# Patient Record
Sex: Female | Born: 1955 | Race: Black or African American | Hispanic: No | Smoking: Never smoker
Health system: Southern US, Community
[De-identification: ages and names within clinical notes are randomized; demographics above are authoritative.]

## PROBLEM LIST (undated history)

## (undated) DIAGNOSIS — C50919 Malignant neoplasm of unspecified site of unspecified female breast: Secondary | ICD-10-CM

## (undated) HISTORY — DX: Malignant neoplasm of unspecified site of unspecified female breast: C50.919

## (undated) HISTORY — PX: APPENDECTOMY: SHX54

## (undated) HISTORY — PX: OTHER SURGICAL HISTORY: SHX169

---

## 1997-10-10 ENCOUNTER — Other Ambulatory Visit: Admission: RE | Admit: 1997-10-10 | Discharge: 1997-10-10 | Payer: Self-pay | Admitting: *Deleted

## 1998-10-16 ENCOUNTER — Other Ambulatory Visit: Admission: RE | Admit: 1998-10-16 | Discharge: 1998-10-16 | Payer: Self-pay | Admitting: Obstetrics and Gynecology

## 2000-02-04 ENCOUNTER — Other Ambulatory Visit: Admission: RE | Admit: 2000-02-04 | Discharge: 2000-02-04 | Payer: Self-pay | Admitting: *Deleted

## 2000-07-02 ENCOUNTER — Encounter: Admission: RE | Admit: 2000-07-02 | Discharge: 2000-07-02 | Payer: Self-pay | Admitting: *Deleted

## 2000-07-02 ENCOUNTER — Encounter: Payer: Self-pay | Admitting: *Deleted

## 2001-07-12 ENCOUNTER — Other Ambulatory Visit: Admission: RE | Admit: 2001-07-12 | Discharge: 2001-07-12 | Payer: Self-pay | Admitting: *Deleted

## 2002-11-01 ENCOUNTER — Other Ambulatory Visit: Admission: RE | Admit: 2002-11-01 | Discharge: 2002-11-01 | Payer: Self-pay | Admitting: *Deleted

## 2005-10-02 ENCOUNTER — Encounter: Admission: RE | Admit: 2005-10-02 | Discharge: 2005-10-02 | Payer: Self-pay | Admitting: Internal Medicine

## 2005-10-08 ENCOUNTER — Encounter: Admission: RE | Admit: 2005-10-08 | Discharge: 2006-01-06 | Payer: Self-pay | Admitting: Internal Medicine

## 2005-11-06 ENCOUNTER — Encounter: Admission: RE | Admit: 2005-11-06 | Discharge: 2005-11-06 | Payer: Self-pay | Admitting: Internal Medicine

## 2006-10-20 ENCOUNTER — Encounter (INDEPENDENT_AMBULATORY_CARE_PROVIDER_SITE_OTHER): Payer: Self-pay | Admitting: Surgery

## 2006-10-20 ENCOUNTER — Encounter: Admission: RE | Admit: 2006-10-20 | Discharge: 2006-10-20 | Payer: Self-pay | Admitting: Internal Medicine

## 2006-10-20 ENCOUNTER — Inpatient Hospital Stay (HOSPITAL_COMMUNITY): Admission: EM | Admit: 2006-10-20 | Discharge: 2006-10-22 | Payer: Self-pay | Admitting: Emergency Medicine

## 2009-01-10 IMAGING — CT CT ABDOMEN W/ CM
2 of 5 series · 17 of 46 positions shown, 19 images · IV contrast (READICAT/WATER & [ID] OMNI 300)
Comparison: None

ABDOMEN CT WITH CONTRAST

CLINICAL DATA: Abdominal pain in the right lower quadrant.
TECHNIQUE: Multidetector CT imaging of the abdomen and pelvis was performed
following the standard protocol during bolus administration of intravenous
contrast.

Contrast:  125 cc Omnipaque 300

[Series 2: abdomen w/ · axial · 0.70mm/px · z∈[-306,+74]mm · 14 of 81 slices shown, 16 images]
[im 5/81  soft-tissue]
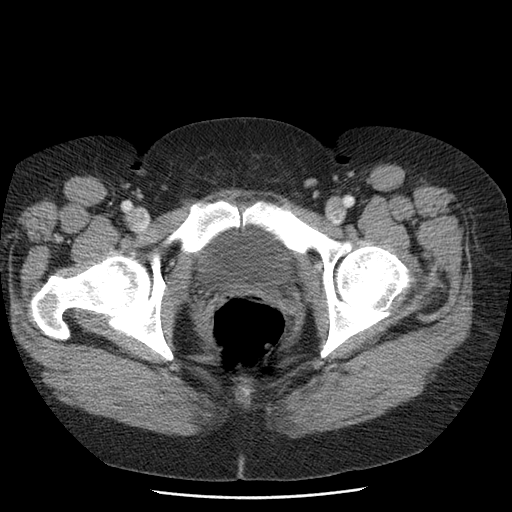
[im 5/81  bone]
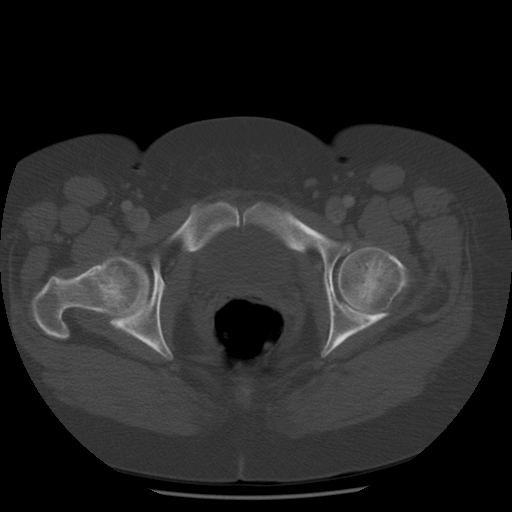
[im 9/81  soft-tissue]
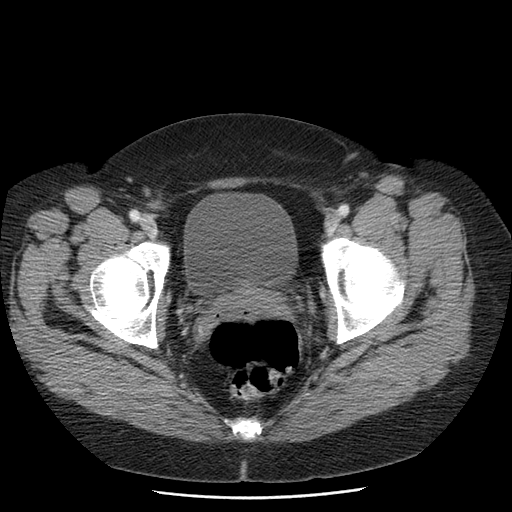
[im 17/81  soft-tissue]
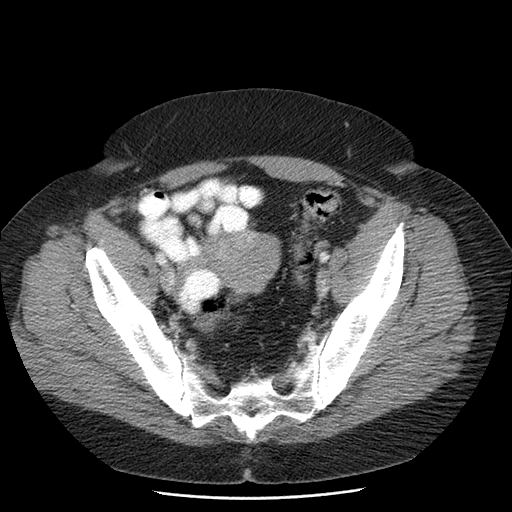
[im 22/81  soft-tissue]
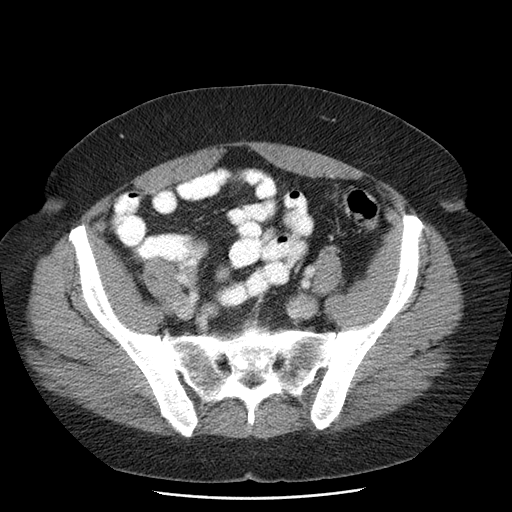
[im 26/81  soft-tissue]
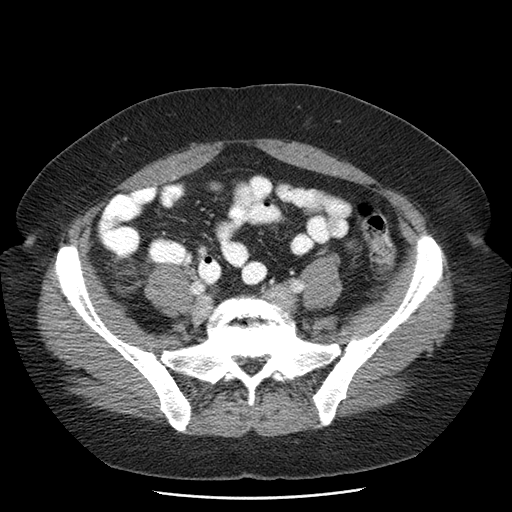
[im 34/81  soft-tissue]
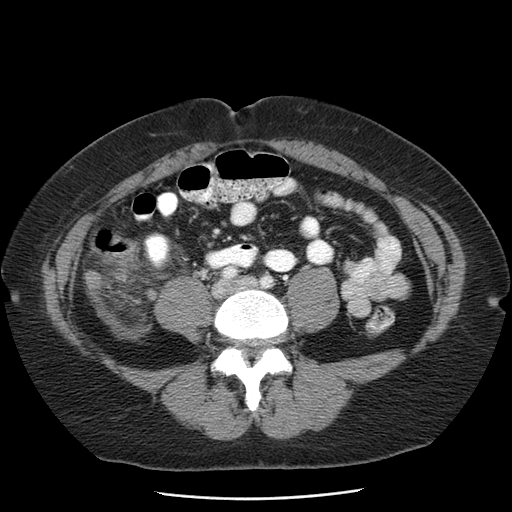
[im 38/81  soft-tissue]
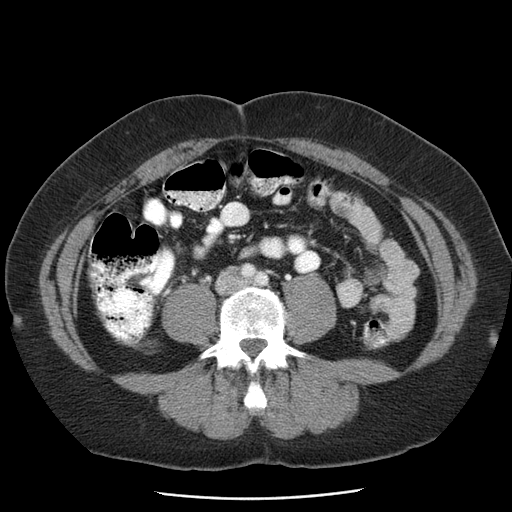
[im 43/81  soft-tissue]
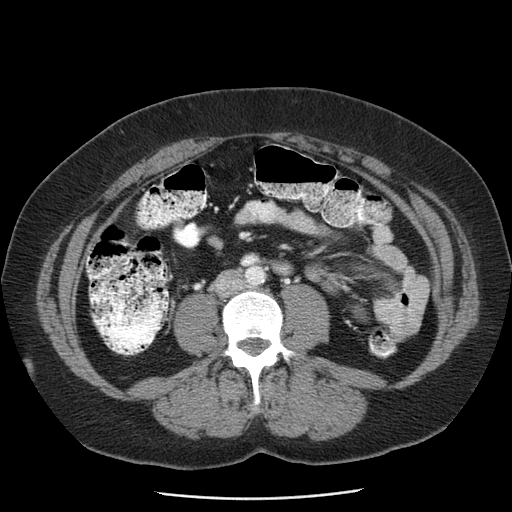
[im 47/81  soft-tissue]
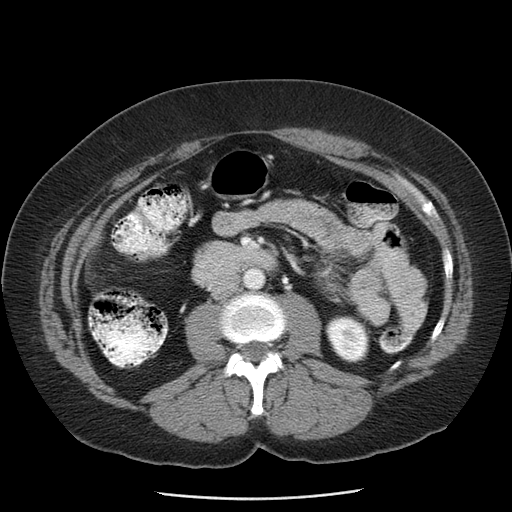
[im 47/81  bone]
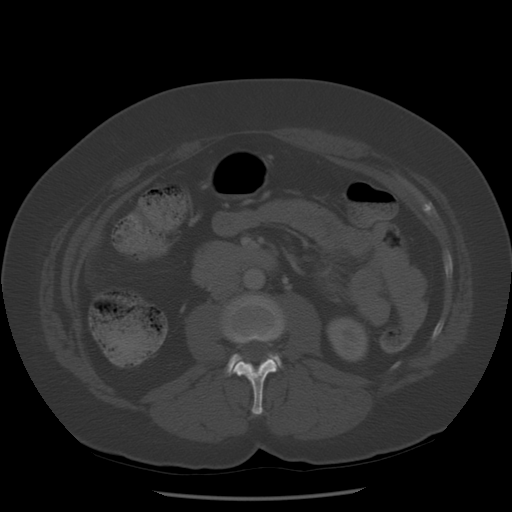
[im 55/81  soft-tissue]
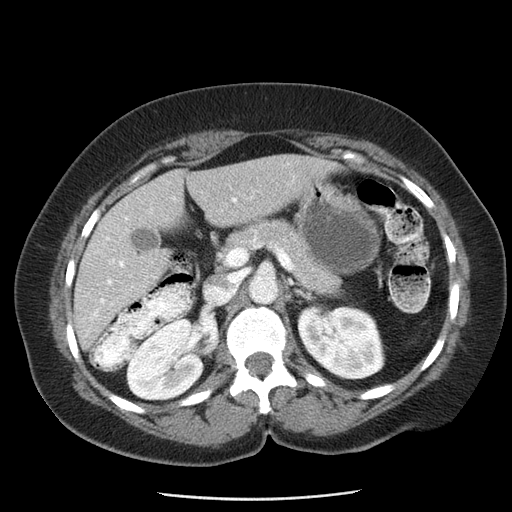
[im 59/81  soft-tissue]
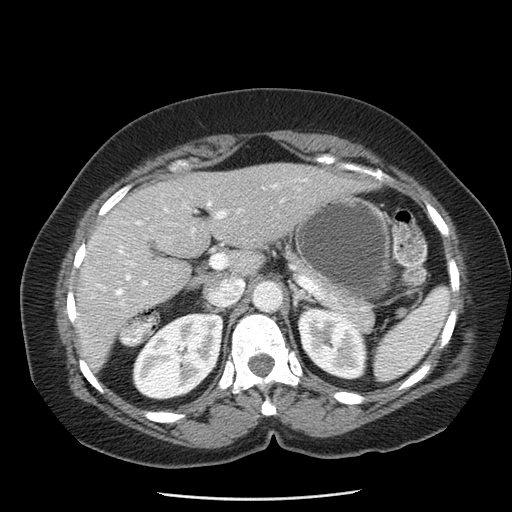
[im 64/81  soft-tissue]
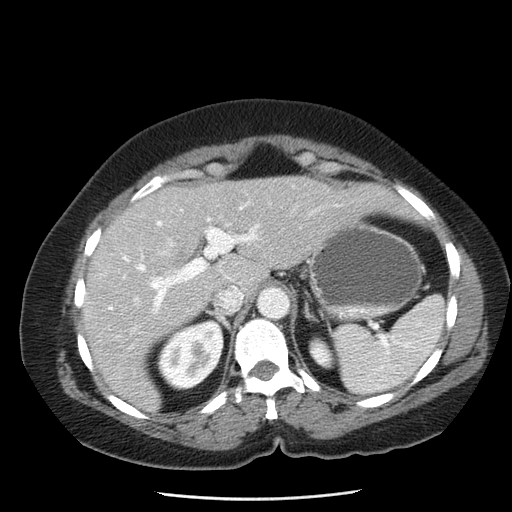
[im 72/81  soft-tissue]
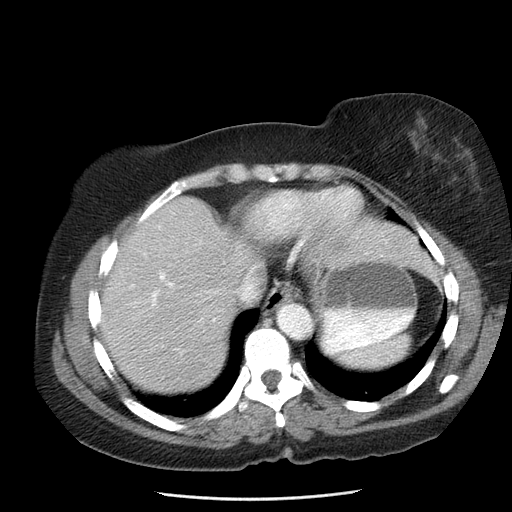
[im 76/81  soft-tissue]
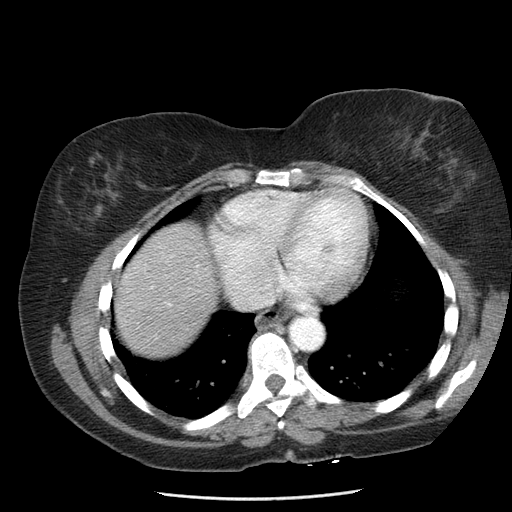

[Series 401: coronal · coronal · 0.91mm/px · 3 of 127 slices shown]
[im 43/127  soft-tissue]
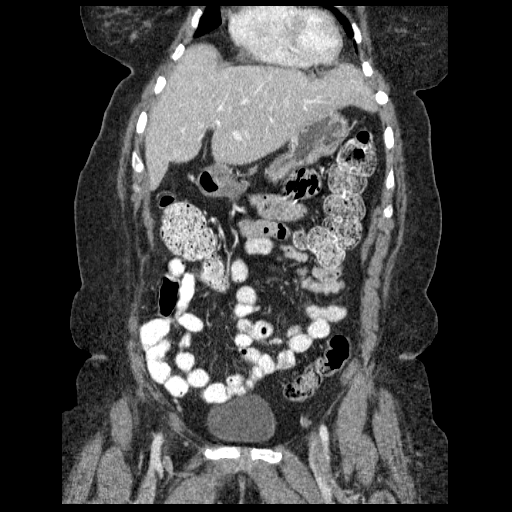
[im 57/127  soft-tissue]
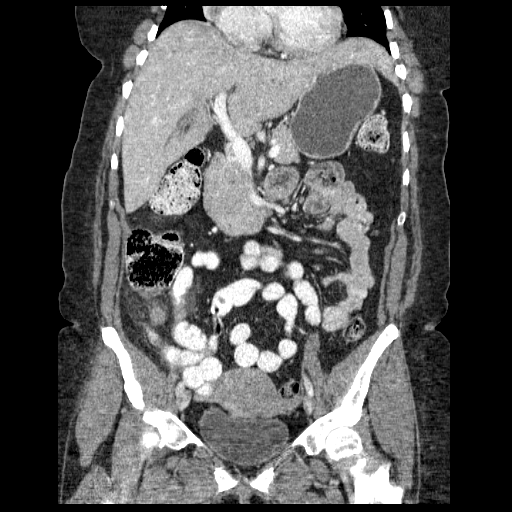
[im 71/127  soft-tissue]
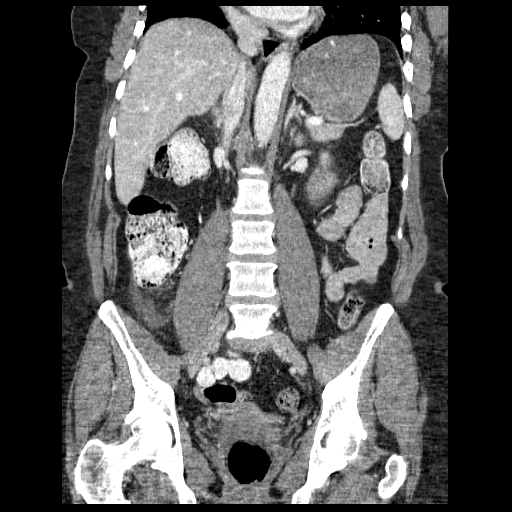

[17 of 46 positions shown; findings below may reference images not displayed]

FINDINGS: The liver, gallbladder, spleen, pancreas, and adrenal glands appear
unremarkable. The kidneys appear normal. No pathologic retroperitoneal
adenopathy in the upper abdomen is identified.

IMPRESSION

No acute upper abdominal findings.

PELVIS CT WITH CONTRAST
FINDINGS: Acute appendicitis is present, with the appendix just below the cecum
at the level of the iliac crests, and with surrounding edema and considerable
appendiceal wall thickening. No definite extraluminal gas is identified.
Terminal ileum appears unremarkable.

There is mildly heterogeneous enhancement in the anterior portion of the uterus,
such that small fibroids cannot be excluded. This could possibly simply be
incidental.

IMPRESSION

1. Acute appendicitis. No periappendiceal abscess or extraluminal gas is
currently identified. 
2. Possible small right anterior uterine body fibroid.
3. I discussed these results with Dr. Danieke by telephone at 5244 hours on
10/20/2006.

## 2010-10-07 NOTE — Op Note (Signed)
Ellen Davenport, HABERL                ACCOUNT NO.:  [id number]   MEDICAL RECORD NO.:  [id number]          PATIENT TYPE:  INP   LOCATION:  0098                         FACILITY:  [facility]   PHYSICIAN:  Thornton Park. Daphine Deutscher, MD  DATE OF BIRTH:  [birth year]   DATE OF PROCEDURE:  10/20/2006  DATE OF DISCHARGE:                               OPERATIVE REPORT   PREOPERATIVE DIAGNOSIS:  55 year old lady with acute appendicitis.   POSTOPERATIVE DIAGNOSIS:  Acute appendicitis with periappendiceal  phlegmon.   OPERATION:  Laparoscopic appendectomy with repair of umbilical hernia.   SURGEON:  Thornton Park. Daphine Deutscher, M.D.   ASSISTANT:  None.   ANESTHESIA:  General.   SPECIMEN:  Appendix.   ESTIMATED BLOOD LOSS:  Minimal.   DESCRIPTION OF PROCEDURE:  Neziah Vogelgesang is a 55 year old lady sent over  with a CT confirmed diagnosis of appendicitis and a two day history of  abdominal pain in the right lower quadrant.  After prepping with  [employer], I made a longitudinal incision due to an umbilicus that had  an obvious umbilical hernia.  I dissected out a preperitoneal fat wad  that was incarcerated up in this little hernia and I went ahead and took  that off with a harmonic scalpel.  A 2-0 Vicryl was used for the [facility]  to hold it in place while I placed it and insufflated the abdomen.  A  10/11 was placed in the left lower quadrant and then a 5 in the right  upper quadrant.   Attention was directed the right lower quadrant where the cecum was very  markedly inflamed, indurated, and stuck down.  Ii identified the  terminal ileum as it came into the cecum and found the appendix to be  stuck to the bloodless fold and a marked inflammatory reaction of this  large round mass that seemed to be indenting into the cecum.  It was  partially retrocecal.  I used a harmonic laterally to begin mobilization  but it was markedly stuck.  I then identified the Cox Jennye Moccasin which is  the fat pad along the terminal ileum and  we pulled this off, this  inflammatory mass, with gentle dissection, freeing the terminal ileum  from this.  I then mobilized and identified the base at the cecum and  the stapler was applied across the base, fired but it deployed some  staples and misfired.  I removed it and, at this time, I had to go back  and get a little different angle to resect that area that had previously  been applied with the stapler.  In two passes, I went through this area  resecting the base of the appendix and then I used a harmonic to get out  the rest of the mesentery of the appendix and placed the appendix in a  bag and I brought it out through the umbilicus after having enlarged  this a little bit.  This was a firm mass which was consistent with an  inflammatory phlegmon of appendix and surrounding tissue.  No bleeding  was noted at the end, I gave adequate  time watching it and irrigating  it.  I repaired the umbilical defect with three sutures of 0 Prolene and  going back in with the scope and  did not see any bleeding.  The abdomen was deflated, the ports were  removed, injected with Marcaine, and closed with 4-0 Vicryl, Benzoin,  and Steri-Strips.  The patient seemed to tolerate the procedure well and  was taken to the recovery room in satisfactory addition.      Thornton Park Daphine Deutscher, MD  Electronically Signed     MBM/MEDQ  D:  10/20/2006  T:  10/20/2006  Job:  [id number]   cc:   Georgann Housekeeper, MD  Fax: [phone]

## 2012-04-07 ENCOUNTER — Other Ambulatory Visit: Payer: Self-pay

## 2019-07-06 ENCOUNTER — Ambulatory Visit: Payer: Self-pay | Attending: Family

## 2019-07-06 DIAGNOSIS — Z23 Encounter for immunization: Secondary | ICD-10-CM | POA: Insufficient documentation

## 2019-07-06 NOTE — Progress Notes (Signed)
   Covid-19 Vaccination Clinic  Name:  Ellen Davenport    MRN: [id number] DOB: 1956/01/25  07/06/2019  Ms. Near was observed post Covid-19 immunization for 15 minutes without incidence. She was provided with Vaccine Information Sheet and instruction to access the V-Safe system.   [name] was instructed to call 911 with any severe reactions post vaccine: Marland Kitchen Difficulty breathing  . Swelling of your face and throat  . A fast heartbeat  . A bad rash all over your body  . Dizziness and weakness    Immunizations Administered    Name Date Dose VIS Date Route   Moderna COVID-19 Vaccine 07/06/2019 10:16 AM 0.5 mL 04/25/2019 Intramuscular   Manufacturer: Moderna   Lot: [id number]   [place]:[id number]

## 2019-08-08 ENCOUNTER — Ambulatory Visit: Payer: Self-pay | Attending: Family

## 2019-08-08 DIAGNOSIS — Z23 Encounter for immunization: Secondary | ICD-10-CM

## 2019-08-08 NOTE — Progress Notes (Signed)
   Covid-19 Vaccination Clinic  Name:  Melodey Houlton    MRN: [id number] DOB: 09-Nov-1955  08/08/2019  Ms. Perlstein was observed post Covid-19 immunization for 15 minutes without incident. She was provided with Vaccine Information Sheet and instruction to access the V-Safe system.   Ms. Ortez was instructed to call 911 with any severe reactions post vaccine: Marland Kitchen Difficulty breathing  . Swelling of face and throat  . A fast heartbeat  . A bad rash all over body  . Dizziness and weakness   Immunizations Administered    Name Date Dose VIS Date Route   Moderna COVID-19 Vaccine 08/08/2019  4:12 PM 0.5 mL 04/25/2019 Intramuscular   Manufacturer: Moderna   Lot: [id number]   [place]BE:[id number]

## 2021-02-26 ENCOUNTER — Other Ambulatory Visit: Payer: Self-pay | Admitting: Radiology

## 2021-03-03 ENCOUNTER — Telehealth: Payer: Self-pay | Admitting: Oncology

## 2021-03-03 ENCOUNTER — Encounter: Payer: Self-pay | Admitting: Internal Medicine

## 2021-03-03 NOTE — Telephone Encounter (Signed)
Spoke to patient to confirm morning clinic on 10/19, [facility] will send packet

## 2021-03-06 ENCOUNTER — Encounter: Payer: Self-pay | Admitting: *Deleted

## 2021-03-06 DIAGNOSIS — D0512 Intraductal carcinoma in situ of left breast: Secondary | ICD-10-CM | POA: Insufficient documentation

## 2021-03-12 ENCOUNTER — Ambulatory Visit
Admission: RE | Admit: 2021-03-12 | Discharge: 2021-03-12 | Disposition: A | Discharge status: Home / Self Care | Payer: Medicare Other | Source: Ambulatory Visit | Attending: Radiation Oncology | Admitting: Radiation Oncology

## 2021-03-12 ENCOUNTER — Ambulatory Visit: Payer: Medicare Other | Admitting: Physical Therapy

## 2021-03-12 ENCOUNTER — Other Ambulatory Visit: Payer: Self-pay

## 2021-03-12 ENCOUNTER — Encounter: Payer: Self-pay | Admitting: Oncology

## 2021-03-12 ENCOUNTER — Encounter: Payer: Self-pay | Admitting: *Deleted

## 2021-03-12 ENCOUNTER — Inpatient Hospital Stay: Payer: Medicare Other | Attending: Oncology

## 2021-03-12 ENCOUNTER — Inpatient Hospital Stay (HOSPITAL_BASED_OUTPATIENT_CLINIC_OR_DEPARTMENT_OTHER): Payer: Medicare Other | Admitting: Oncology

## 2021-03-12 VITALS — BP 143/78 | HR 85 | Temp 97.3°F | Resp 19 | Wt 210.0 lb

## 2021-03-12 DIAGNOSIS — D0512 Intraductal carcinoma in situ of left breast: Secondary | ICD-10-CM

## 2021-03-12 DIAGNOSIS — Z170 Estrogen receptor positive status [ER+]: Secondary | ICD-10-CM

## 2021-03-12 LAB — CBC WITH DIFFERENTIAL (CANCER CENTER ONLY)
Abs Immature Granulocytes: 0.01 10*3/uL (ref 0.00–0.07)
Basophils Absolute: 0 10*3/uL (ref 0.0–0.1)
Basophils Relative: 0 %
Eosinophils Absolute: 0.1 10*3/uL (ref 0.0–0.5)
Eosinophils Relative: 2 %
HCT: 39.5 % (ref 36.0–46.0)
Hemoglobin: 13 g/dL (ref 12.0–15.0)
Immature Granulocytes: 0 %
Lymphocytes Relative: 40 %
Lymphs Abs: 2.2 10*3/uL (ref 0.7–4.0)
MCH: 27.8 pg (ref 26.0–34.0)
MCHC: 32.9 g/dL (ref 30.0–36.0)
MCV: 84.4 fL (ref 80.0–100.0)
Monocytes Absolute: 0.5 10*3/uL (ref 0.1–1.0)
Monocytes Relative: 9 %
Neutro Abs: 2.6 10*3/uL (ref 1.7–7.7)
Neutrophils Relative %: 49 %
Platelet Count: 276 10*3/uL (ref 150–400)
RBC: 4.68 MIL/uL (ref 3.87–5.11)
RDW: 12.8 % (ref 11.5–15.5)
WBC Count: 5.4 10*3/uL (ref 4.0–10.5)
nRBC: 0 % (ref 0.0–0.2)

## 2021-03-12 LAB — CMP (CANCER CENTER ONLY)
ALT: 8 U/L (ref 0–44)
AST: 15 U/L (ref 15–41)
Albumin: 3.8 g/dL (ref 3.5–5.0)
Alkaline Phosphatase: 97 U/L (ref 38–126)
Anion gap: 11 (ref 5–15)
BUN: 9 mg/dL (ref 8–23)
CO2: 25 mmol/L (ref 22–32)
Calcium: 9.2 mg/dL (ref 8.9–10.3)
Chloride: 106 mmol/L (ref 98–111)
Creatinine: 0.77 mg/dL (ref 0.44–1.00)
GFR, Estimated: >60 mL/min (ref >60)
Glucose, Bld: 108 mg/dL — ABNORMAL HIGH (ref 70–99)
Potassium: 3.7 mmol/L (ref 3.5–5.1)
Sodium: 142 mmol/L (ref 135–145)
Total Bilirubin: 0.6 mg/dL (ref 0.3–1.2)
Total Protein: 7.9 g/dL (ref 6.5–8.1)

## 2021-03-12 LAB — GENETIC SCREENING ORDER

## 2021-03-12 NOTE — Progress Notes (Addendum)
Radiation Oncology         [phone] ________________________________  Name: Ellen Davenport         MRN: [id number]  Date of Service: 03/12/2021 DOB: 10-29-1955  EH:MCNOBS, Denton Ar, MD  Erroll Luna, MD     REFERRING PHYSICIAN: Erroll Luna, MD   DIAGNOSIS: The encounter diagnosis was Ductal carcinoma in situ (DCIS) of left breast.   HISTORY OF PRESENT ILLNESS: Ellen Davenport is a 65 y.o. female seen in the multidisciplinary breast clinic for a new diagnosis of left breast cancer. The patient was noted to have a screening detected group of calcifications in the upper outer quadrant. Diagnostic imaging measured these as a linear group up to 5 mm. A stereotactic biopsy showed an intermediate grade DCIS with calcifications and the tumor was ER/PR positive. She's seen to discuss treatment recommendations of her cancer.    PREVIOUS RADIATION THERAPY: No   PAST MEDICAL HISTORY:  Past Medical History:  Diagnosis Date   Breast cancer ([place])        PAST SURGICAL HISTORY: Past Surgical History:  Procedure Laterality Date   APPENDECTOMY       FAMILY HISTORY: No family history on file.   SOCIAL HISTORY:   The patient is married and lives in [place]. She is retired from working in Clinical biochemist for [employer]. She is very active.   ALLERGIES: Patient has no allergy information on record.   MEDICATIONS:  No current outpatient medications on file.   No current facility-administered medications for this encounter.     REVIEW OF SYSTEMS: On review of systems, the patient reports that she is doing well overall. She does have some soreness in her breast since her biopsy. She has chronic back pain from sciatica. No other complaints are verbalized.   PHYSICAL EXAM:  Wt Readings from Last 3 Encounters:  03/12/21 210 lb (95.3 kg)   Temp Readings from Last 3 Encounters:  03/12/21 (!) 97.3 F (36.3 C) (Temporal)   BP Readings from Last 3 Encounters:  03/12/21 (!)  143/78   Pulse Readings from Last 3 Encounters:  03/12/21 85    In general this is a well appearing African American female in no acute distress. She's alert and oriented x4 and appropriate throughout the examination. Cardiopulmonary assessment is negative for acute distress and she exhibits normal effort. Bilateral breast exam is deferred.    ECOG = 1  0 - Asymptomatic (Fully active, able to carry on all predisease activities without restriction)  1 - Symptomatic but completely ambulatory (Restricted in physically strenuous activity but ambulatory and able to carry out work of a light or sedentary nature. For example, light housework, office work)  2 - Symptomatic, <50% in bed during the day (Ambulatory and capable of all self care but unable to carry out any work activities. Up and about more than 50% of waking hours)  3 - Symptomatic, >50% in bed, but not bedbound (Capable of only limited self-care, confined to bed or chair 50% or more of waking hours)  4 - Bedbound (Completely disabled. Cannot carry on any self-care. Totally confined to bed or chair)  5 - Death   Eustace Pen MM, Creech RH, Tormey DC, et al. [phone]). "Toxicity and response criteria of the [facility]". [place] Oncol. 5 (6): 649-55    LABORATORY DATA:  Lab Results  Component Value Date   WBC 5.4 03/12/2021   HGB 13.0 03/12/2021   HCT 39.5 03/12/2021   MCV 84.4 03/12/2021  PLT 276 03/12/2021   Lab Results  Component Value Date   NA 142 03/12/2021   K 3.7 03/12/2021   CL 106 03/12/2021   CO2 25 03/12/2021   Lab Results  Component Value Date   ALT 8 03/12/2021   AST 15 03/12/2021   ALKPHOS 97 03/12/2021   BILITOT 0.6 03/12/2021      RADIOGRAPHY: No results found.     IMPRESSION/PLAN: 1. Intermediate Grade, ER/PR positive DCIS of the left breast. Dr. Lisbeth Renshaw discusses the pathology findings and reviews the nature of left breast disease. The consensus from the breast conference  includes breast conservation with lumpectomy versus COMET Trial. If she undergoes breast conservation, Dr. Lisbeth Renshaw anticipates external radiotherapy to the breast  to reduce risks of local recurrence followed by antiestrogen therapy. We discussed the risks, benefits, short, and long term effects of radiotherapy, as well as the curative intent, and the patient is interested in proceeding. Dr. Lisbeth Renshaw discusses the delivery and logistics of radiotherapy and anticipates a course of 4 weeks of radiotherapy to the left breast with deep inspiration breath hold tehcnique. We will see her back a few weeks after surgery depending on how she randomizes in the COMET trial.   In a visit lasting 60 minutes, greater than 50% of the time was spent face to face reviewing her case, as well as in preparation of, discussing, and coordinating the patient's care.  The above documentation reflects my direct findings during this shared patient visit. Please see the separate note by Dr. Lisbeth Renshaw on this date for the remainder of the patient's plan of care.    Carola Rhine, [facility]    **Disclaimer: This note was dictated with voice recognition software. Similar sounding words can inadvertently be transcribed and this note may contain transcription errors which may not have been corrected upon publication of note.**

## 2021-03-12 NOTE — Progress Notes (Signed)
[place]  Telephone:(336) [phone] Fax:(336) [phone]     ID: Ellen Davenport DOB: [birth year]  MR#: [id number]  YHC#:[id number]  Patient Care Team: Wenda Low, MD as PCP - General (Internal Medicine) Rockwell Germany, RN as Oncology Nurse Navigator Mauro Kaufmann, RN as Oncology Nurse Navigator Anibal Quinby, Virgie Dad, MD as Consulting Physician (Oncology) Erroll Luna, MD as Consulting Physician (General Surgery) Kyung Rudd, MD as Consulting Physician (Radiation Oncology) Chauncey Cruel, MD OTHER MD:  CHIEF COMPLAINT: noninvasive breast cancer, estrogen receptor positive  CURRENT TREATMENT: Considering, trial   HISTORY OF CURRENT ILLNESS: Ellen Davenport was under short-term follow up for probably-benign left breast calcifications. She underwent left diagnostic mammography with [facility] on 02/05/2021 showing: breast density category B; suspicious 0.5 cm grouped calcifications in upper-outer left breast.  Accordingly on 02/26/2021 she proceeded to biopsy of the left breast area in question. The pathology from this procedure ([id number]) showed: ductal carcinoma in situ with calcifications, intermediate grade. Prognostic indicators significant for: estrogen receptor, 100% positive and progesterone receptor, 100% positive, both with strong staining intensity.   Cancer Staging Ductal carcinoma in situ (DCIS) of left breast Staging form: Breast, AJCC 8th Edition - Clinical stage from 03/12/2021: Stage 0 (cTis (DCIS), cN0, cM0, ER+, PR+) - Signed by Chauncey Cruel, MD on 03/12/2021 Stage prefix: Initial diagnosis Nuclear grade: G2  The patient's subsequent history is as detailed below.   INTERVAL HISTORY: Ellen Davenport was evaluated in the multidisciplinary breast cancer clinic on 03/12/2021 accompanied by her son "'Ree". Her case was also presented at the multidisciplinary breast cancer conference on the same day. At that time a preliminary plan was proposed:  Consider, trial, otherwise surgery radiation and antiestrogens.   REVIEW OF SYSTEMS: There were no specific symptoms leading to the original mammogram, which was routinely scheduled. On the provided questionnaire, she reports wearing glasses and back pain. The patient denies unusual headaches, visual changes, nausea, vomiting, stiff neck, dizziness, or gait imbalance. There has been no cough, phlegm production, or pleurisy, no chest pain or pressure, and no change in bowel or bladder habits. The patient denies fever, rash, bleeding, unexplained fatigue or unexplained weight loss. A detailed review of systems was otherwise entirely negative.   COVID 19 VACCINATION STATUS: Moderna x2   PAST MEDICAL HISTORY: Past Medical History:  Diagnosis Date   Breast cancer ([place])     PAST SURGICAL HISTORY: Past Surgical History:  Procedure Laterality Date   APPENDECTOMY      FAMILY HISTORY: History reviewed. No pertinent family history.  Her father died at age 33 from MI. Her mother died at age 41 from Alzheimer's. Flonnie has 3 brothers and 3 sisters. There is no family history of cancer to her knowledge.   GYNECOLOGIC HISTORY:  No LMP recorded. Menarche: 65 years old Age at first live birth: 65 years old [place] P 2 LMP age 60 Contraceptive: yes HRT never used  Hysterectomy? no BSO? 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 She is separated. She lives at home with her son [name], age 63, and his son [name], who is 40 years old and works for [employer]. [name] works as a Health and safety inspector. Son [name] Wickes"), age 47, works as a Programme researcher, broadcasting/film/video in [place]. [name] is Shayann's only grandchild. Haeven attends an [employer] in [place]: son [name] is her HCPOA.   HEALTH MAINTENANCE:  Colonoscopy: 2022 at [place]  PAP: 2022  Bone density: date unknown   Not on File  No current outpatient  medications on file.   No current facility-administered medications for this visit.    OBJECTIVE: African-American woman who appears well  Vitals:   03/12/21 0848  BP: (!) 143/78  Pulse: 85  Resp: 19  Temp: (!) 97.3 F (36.3 C)  SpO2: 99%     There is no height or weight on file to calculate BMI.   Wt Readings from Last 3 Encounters:  03/12/21 210 lb (95.3 kg)      ECOG FS:0 - Asymptomatic  Ocular: Sclerae unicteric, pupils round and equal Ear-nose-throat: Wearing a mask Lymphatic: No cervical or supraclavicular adenopathy Lungs no rales or rhonchi Heart regular rate and rhythm Abd soft, nontender, positive bowel sounds MSK no focal spinal tenderness, left upper extremity lymphedema  Neuro: non-focal, well-oriented, appropriate affect Breasts: The right breast is unremarkable.  The left breast is status post recent biopsy.  There is no palpable mass.  There are no skin or nipple changes of concern.  Both axillae are benign   LAB RESULTS:  CMP     Component Value Date/Time   NA 142 03/12/2021 0836   K 3.7 03/12/2021 0836   CL 106 03/12/2021 0836   CO2 25 03/12/2021 0836   GLUCOSE 108 (H) 03/12/2021 0836   BUN 9 03/12/2021 0836   CREATININE 0.77 03/12/2021 0836   CALCIUM 9.2 03/12/2021 0836   PROT 7.9 03/12/2021 0836   ALBUMIN 3.8 03/12/2021 0836   AST 15 03/12/2021 0836   ALT 8 03/12/2021 0836   ALKPHOS 97 03/12/2021 0836   BILITOT 0.6 03/12/2021 0836   GFRNONAA >60 03/12/2021 0836    No results found for: TOTALPROTELP, ALBUMINELP, A1GS, A2GS, BETS, BETA2SER, GAMS, MSPIKE, SPEI  Lab Results  Component Value Date   WBC 5.4 03/12/2021   NEUTROABS 2.6 03/12/2021   HGB 13.0 03/12/2021   HCT 39.5 03/12/2021   MCV 84.4 03/12/2021   PLT 276 03/12/2021    No results found for: LABCA2  No components found for: XNATFT732  No results for input(s): INR in the last 168 hours.  No results found for: LABCA2  No results found for: KGU542  No results found  for: HCW237  No results found for: SEG315  No results found for: CA2729  No components found for: HGQUANT  No results found for: CEA1 / No results found for: CEA1   No results found for: AFPTUMOR  No results found for: CHROMOGRNA  No results found for: KPAFRELGTCHN, LAMBDASER, KAPLAMBRATIO (kappa/lambda light chains)  No results found for: HGBA, HGBA2QUANT, HGBFQUANT, HGBSQUAN (Hemoglobinopathy evaluation)   No results found for: LDH  No results found for: IRON, TIBC, IRONPCTSAT (Iron and TIBC)  No results found for: FERRITIN  Urinalysis No results found for: COLORURINE, APPEARANCEUR, LABSPEC, PHURINE, GLUCOSEU, HGBUR, BILIRUBINUR, KETONESUR, PROTEINUR, UROBILINOGEN, NITRITE, LEUKOCYTESUR   STUDIES: No results found.   ELIGIBLE FOR AVAILABLE RESEARCH PROTOCOL: COMET  ASSESSMENT: 65 y.o. [place] woman status post left breast biopsy 02/26/2021 showing ductal carcinoma in situ, grade 2, estrogen and progesterone receptor positive  (1) considering COMET trial versus standard of care  (2) antiestrogens  PLAN: I met today with Ellen Davenport to review her new diagnosis. Specifically we discussed the biology of her breast cancer, its diagnosis, staging, treatment  options and prognosis.Ezell understands that in noninvasive ductal carcinoma, also called ductal carcinoma in situ ("DCIS") the breast cancer cells remain trapped in the  ducts were they started. They cannot travel to a vital organ. For that reason these cancers in themselves are not life-threatening.  If the whole breast is removed then all the ducts are removed and since the cancer cells are trapped in the ducts, the cure rate with mastectomy for noninvasive breast cancer is approximately 99%. Nevertheless standard of care is lumpectomy, because there is no survival advantage to mastectomy and because the cosmetic result is generally superior with breast conservation.  Since the patient is keeping her breast, there will  be some risk of recurrence. The recurrence can only be in the same breast since, again, the cells are trapped in the ducts. There is no connection from one breast to the other. The risk of local recurrence is cut by more than half with radiation, which is standard in this situation.  In estrogen receptor positive cancers like Novice's, anti-estrogens can also be considered. They will further reduce the risk of recurrence by one half. In addition anti-estrogens will lower the risk of a new breast cancer developing in either breast, also by one half. That risk otherwise approaches 1% per year.   Accordingly the standard of care involves surgery, followed by radiation, then a discussion of anti-estrogens.  However Ellen Davenport qualifies for participation in the COMET trial.  She is very interested in avoiding surgery and radiation if possible and will be meeting with the research nurse today.  If she is randomized to observation we will be starting her on antiestrogens in approximately 2 weeks.  If she is randomized to standard of care of course the antiestrogen was stopped to wait until completion of her surgery and radiation treatment.  Ellen Davenport has a good understanding of the overall plan. She agrees with it. She will call with any problems that may develop before her next visit here.  Total encounter time 55 minutes.Ellen Davenport C. Moncia Annas, MD 03/12/2021 11:20 AM Medical Oncology and Hematology [facility] [place], [place] [address] Tel. [phone]    Fax. [phone]   This document serves as a record of services personally performed by Ellen Del, MD. It was created on his behalf by Wilburn Mylar, a trained medical scribe. The creation of this record is based on the scribe's personal observations and the provider's statements to them.   I, Ellen Del MD, have reviewed the above documentation for accuracy and completeness, and I agree with the  above.    *Total Encounter Time as defined by the Centers for Medicare and Medicaid Services includes, in addition to the face-to-face time of a patient visit (documented in the note above) non-face-to-face time: obtaining and reviewing outside history, ordering and reviewing medications, tests or procedures, care coordination (communications with other health care professionals or caregivers) and documentation in the medical record.

## 2021-03-12 NOTE — Progress Notes (Signed)
Ellen Davenport  Initial Assessment   Ellen Davenport is a 65 y.o. year old female accompanied by grandson . Clinical Social Davenport was referred by [facility] for assessment of psychosocial needs.   SDOH (Social Determinants of Health) assessments performed: Yes SDOH Interventions    Flowsheet Row Most Recent Value  SDOH Interventions   Food Insecurity Interventions Intervention Not Indicated  Financial Strain Interventions Intervention Not Indicated  Housing Interventions Intervention Not Indicated  Transportation Interventions Intervention Not Indicated       Distress Screen completed: Yes ONCBCN DISTRESS SCREENING 03/12/2021  Screening Type Initial Screening  Distress experienced in past week (1-10) 3  Information Concerns Type Lack of info about treatment;Lack of info about complementary therapy choices  Physical Problem type Tingling hands/feet      Family/Social Information:  Housing Arrangement: patient lives with son Ellen Davenport  and grandson Ellen Davenport. Family members/support persons in your life? Family and Church. Pt has two sons, one lives with her, the other in [place], [place]. Pt's sister-in-law is breast cancer survivor. 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 yes Concerns about diagnosis and/or treatment: I'm not especially worried about anything Patient reported stressors: Adjusting to my illness Hopes and priorities: recovery and travel. Patient enjoys time with family/ friends and travel. Current coping skills/ strengths: Ability for insight , Average or above average intelligence , Motivation for treatment/growth ,  Religious Affiliation , and Supportive family/friends     SUMMARY: Current SDOH Barriers:  None identified at this time.   Interventions: Discussed common feeling and emotions when being diagnosed with cancer, and the importance of support during treatment Informed patient of the support team roles and support services at [facility] Provided CSW contact information and encouraged patient to call with any questions or concerns   Follow Up Plan: Patient will contact CSW with any support or resource needs Patient verbalizes understanding of plan: Yes   Rosary Lively, BSW Intern Supervised by Kennith Center , LCSW

## 2021-03-12 NOTE — Research (Signed)
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 Jana Hakim as a potential candidate for the above listed study.  This Clinical Research Nurse met with Ellen Davenport, [id number], on 03/12/21 in a manner and location that ensures patient privacy to discuss participation in the above listed research study.  Patient is Accompanied by the family member .  A copy of the informed consent document with embedded HIPAA language was provided to the patient.  Patient reads, speaks, and understands [place].   Patient was provided with the business card of this Nurse and encouraged to contact the research team with any questions.  Approximately 20 minutes was spent with the patient reviewing the informed consent documents.  Patient was provided the option of taking informed consent documents home to review and was encouraged to review at their convenience with their support network, including other care providers. Patient took the consent documents home to review.  The pt requested that a research staff member call her on Monday, 03/17/21 in the morning to discuss her study participation.  This nurse will begin the eligibility review process. Brion Aliment RN, BSN, CCRP Clinical Research Nurse Lead 03/12/2021 10:58 AM

## 2021-03-13 ENCOUNTER — Encounter: Payer: Self-pay | Admitting: *Deleted

## 2021-03-17 ENCOUNTER — Telehealth: Payer: Self-pay

## 2021-03-17 NOTE — Telephone Encounter (Signed)
Call to patient this am by this research nurse. The patient verified her name and date of birth over the telephone as being Ellen Davenport. Explained that this nurse was calling for Doristine Johns, RN to answer any questions she may have in relation to the COMET clinical trial. She states that she has reviewed the protocol but wants to look over it one more time. She is requesting a call back on Wednesday of this week in case she does have questions. The patient states at this time she has not changed her mind and still wants to participate in the trial if she is able to and denies having any questions currently. Email to [place], Therapist, sports in research to inform her of patients wishes.  Jeral Fruit, RN 03/17/21 10:28 AM

## 2021-03-18 ENCOUNTER — Telehealth: Payer: Self-pay | Admitting: *Deleted

## 2021-03-18 ENCOUNTER — Encounter: Payer: Self-pay | Admitting: *Deleted

## 2021-03-18 NOTE — Telephone Encounter (Signed)
Spoke to pt concerning Ellen Davenport from 10.19.22. Denies questions or concerns regarding dx or treatment care plan. Encourage pt to call with needs. Received verbal understanding.

## 2021-03-18 NOTE — Progress Notes (Signed)
[place] CSW Progress Notes  Social Work Intern spoke to patient by phone per [facility] follow up. Patient reports feeling well and that mammogram performed yesterday on other breast looked good.  Regarding the Healthcare power of attorney paperwork patient began filling out during [facility] on 03/12/21- intern informed pt of next Advance Directive clinics scheduled for 10/28 and 11/14 where she can complete directives. Pt will call [phone] to register.  Rosary Lively, Social Work Intern  Supervised by Gwinda Maine, LCSW

## 2021-03-20 ENCOUNTER — Telehealth: Payer: Self-pay | Admitting: *Deleted

## 2021-03-20 NOTE — Telephone Encounter (Signed)
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  The pt was unavailable by phone, and the nurse left messages about the study.  The pt called the nurse this morning, and she agreed to meet with the research nurse on Friday,03/21/21 to discuss the study.  The pt was scheduled at the [facility] for a 10:30 am appt, and she agreed to meet with the research nurse at 9:30 am.  The pt said that she had read the consent form, and she had some questions about the study.  The pt said that she also wanted the support of her family regarding her study participation.  The pt was thanked for her interest in the study.  The pt was reminded that her participation is voluntary and she has plenty of time to consider participation in this study.   Brion Aliment RN, BSN, CCRP Clinical Research Nurse Lead 03/20/2021 11:49 AM

## 2021-03-21 ENCOUNTER — Inpatient Hospital Stay: Payer: Medicare Other | Admitting: *Deleted

## 2021-03-21 ENCOUNTER — Other Ambulatory Visit: Payer: Self-pay

## 2021-03-21 ENCOUNTER — Encounter: Payer: Self-pay | Admitting: *Deleted

## 2021-03-21 ENCOUNTER — Inpatient Hospital Stay: Payer: Medicare Other | Admitting: General Practice

## 2021-03-21 DIAGNOSIS — D0512 Intraductal carcinoma in situ of left breast: Secondary | ICD-10-CM

## 2021-03-21 NOTE — Research (Signed)
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 Jana Hakim as a potential candidate for the above listed study.  This Clinical Research Nurse along with Vickii Penna, research nurse, met with Ellen Davenport, [id number] on 03/21/21 in a manner and location that ensures patient privacy to discuss participation in the above listed research study.  Patient is Unaccompanied.  Patient was previously provided with informed consent documents.  Patient confirmed they have read the informed consent documents.  The pt had marked her consent form with several questions to discuss with the research nurse today.   As outlined in the informed consent form, this Nurse and Ellen Davenport discussed the purpose of the research study, the investigational nature of the study, study procedures and requirements for study participation, potential risks and benefits of study participation, as well as alternatives to participation.  This study is not blinded or double-blinded. The patient understands participation is voluntary and they may withdraw from study participation at any time.  Each study arm was reviewed, and randomization discussed.  This study does not involve an investigational drug or device. This study does not involve a placebo. Patient understands enrollment is pending full eligibility review.   Confidentiality and how the patient's information will be used as part of study participation were discussed.  Patient was informed there is not reimbursement provided for their time and effort spent on trial participation.  The patient is encouraged to discuss research study participation with their insurance provider to determine what costs they may incur as part of study participation, including research related injury.    All questions were answered to the patient's satisfaction.   Approximately 40 minutes was spent with the pt.  The pt was given COMET study materials (pt brochure and pt letter) to review.  The pt confirmed that she already was aware of the COMET study website.  The pt said that she also wanted to discuss the study with her primary care physician before making a decision.  The pt was encouraged to meet with Dr. Jana Hakim next week on 03/27/21 via her already scheduled virtual visit to further discuss the COMET study.  The pt also had questions about endocrine therapy, and the nurse encouraged the pt to discuss this next week with Dr. Jana Hakim.  The pt agreed to talk with the research nurse on Friday, 03/28/21 to discuss her study participation.  The pt was thanked for her continued interest in the clinical trial.    Brion Aliment RN, BSN, CCRP Clinical Research Nurse Lead 03/21/2021 11:36 AM

## 2021-03-21 NOTE — Progress Notes (Signed)
[place] CSW Progress Notes  Patient came to [facility], met w chaplain, was not ready to complete documents.  She will reschedule when ready for documents to be finalized.  Edwyna Shell, LCSW Clinical Social Worker Phone:  [phone]

## 2021-03-25 ENCOUNTER — Other Ambulatory Visit: Payer: Self-pay | Admitting: Oncology

## 2021-03-25 NOTE — Progress Notes (Signed)
Dr. Jana Hakim,  You have a virtual visit with this pt on 03/27/21 at 4:45 pm.  I met with her on 03/21/21 to discuss the COMET study.  She had read over the consent form, and I answered all of her questions.  She was not ready to sign the consent form.  She wanted to get her PCP's opinion about the study.  She also wanted to discuss endocrine therapy with you on Thursday before she decides on whether to participate in the COMET study.  My plan is to call her on Friday, 11/4 to talk with her again about the study.    Thanks, Murlean Hark RN, BSN, [place] Clinical Research Nurse Lead 03/25/2021 2:16 PM

## 2021-03-27 ENCOUNTER — Inpatient Hospital Stay: Payer: Medicare Other | Attending: Oncology | Admitting: Oncology

## 2021-03-27 ENCOUNTER — Telehealth: Payer: Self-pay | Admitting: *Deleted

## 2021-03-27 DIAGNOSIS — D0512 Intraductal carcinoma in situ of left breast: Secondary | ICD-10-CM | POA: Insufficient documentation

## 2021-03-27 MED ORDER — ANASTROZOLE 1 MG PO TABS: 1.0000 mg | ORAL_TABLET | Freq: Every day | ORAL | 4 refills | Status: DC

## 2021-03-27 NOTE — Telephone Encounter (Signed)
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 Jana Hakim that the pt may start anastrozole for 10 days and then enroll into the  COMET study.  The nurse spoke to Dr. Jana Hakim and explained that to be eligible for COMET enrollment - there can not be documented history of prior aromatase inhibitor use in the 6 months prior to registration.  He asked that I call the pt and inform her that she needs to "hold off" starting her anastrozole until she is consented and registered to the COMET study.  The nurse called the pt to discuss anastrozole use and the COMET study, but the pt was not available by phone.  The research nurse left a detailed message about the study criteria for enrolling into the study.  The pt was advised to not begin the anastrozole if she wants to participate in the COMET.  The pt was encouraged to call and speak to Clabe Seal, Research officer, political party, if she has any questions tomorrow.  Clabe Seal agreed to call the pt tomorrow to see if she has any questions.  If the pt is interested in the study, the research staff will coordinate a consent visit next week.  Brion Aliment RN, BSN, CCRP Clinical Research Nurse Lead 03/27/2021 5:25 PM

## 2021-03-27 NOTE — Progress Notes (Signed)
Ellen Davenport  Telephone:([phone] Fax:([phone]) [phone]    ID: Ellen Davenport DOB: August 28, 1955  MR#: [id number]  GUR#:[id number]  Patient Care Team: Ellen Low, MD as PCP - General (Internal Medicine) Ellen Germany, RN as Oncology Nurse Navigator Ellen Kaufmann, RN as Oncology Nurse Navigator Ellen Davenport, Ellen Dad, MD as Consulting Physician (Oncology) Ellen Luna, MD as Consulting Physician (General Surgery) Ellen Rudd, MD as Consulting Physician (Radiation Oncology) Ellen Cruel, MD OTHER MD:  I connected with Ellen Davenport on 03/27/21 at  4:45 PM EDT by video enabled telemedicine visit and verified that I am speaking with the correct person using two identifiers.   I discussed the limitations, risks, security and privacy concerns of performing an evaluation and management service by telemedicine and the availability of in-person appointments. I also discussed with the patient that there may be a patient responsible charge related to this service. The patient expressed understanding and agreed to proceed.   Other persons participating in the visit and their role in the encounter: Home  Patient's location: None Provider's location: [facility]   I provided 15 minutes of face-to-face video visit time during this encounter, and > 50% was spent counseling as documented under my assessment & plan.   CHIEF COMPLAINT: noninvasive breast cancer, estrogen receptor positive  CURRENT TREATMENT: Considering COMET trial   INTERVAL HISTORY: Ellen Davenport was contacted today for follow up of her noninvasive breast cancer. She was evaluated in the multidisciplinary breast cancer clinic on 03/12/2021.  We connected today to further discuss the COMET trial and endocrine therapy.   REVIEW OF SYSTEMS: Ellen Davenport is doing well and is interested in the study.  She had questions regarding antiestrogens.   COVID 19 VACCINATION STATUS: Moderna x2   HISTORY OF CURRENT  ILLNESS: From the original intake note:  Ellen Davenport was under short-term follow up for probably-benign left breast calcifications. She underwent left diagnostic mammography with tomography at [facility] on 02/05/2021 showing: breast density category B; suspicious 0.5 cm grouped calcifications in upper-outer left breast.  Accordingly on 02/26/2021 she proceeded to biopsy of the left breast area in question. The pathology from this procedure ([id number]) showed: ductal carcinoma in situ with calcifications, intermediate grade. Prognostic indicators significant for: estrogen receptor, 100% positive and progesterone receptor, 100% positive, both with strong staining intensity.   Cancer Staging Ductal carcinoma in situ (DCIS) of left breast Staging form: Breast, AJCC 8th Edition - Clinical stage from 03/12/2021: Stage 0 (cTis (DCIS), cN0, cM0, ER+, PR+) - Signed by Ellen Cruel, MD on 03/12/2021 Stage prefix: Initial diagnosis Nuclear grade: G2  The patient's subsequent history is as detailed below.   PAST MEDICAL HISTORY: Past Medical History:  Diagnosis Date   Breast cancer ([place])     PAST SURGICAL HISTORY: Past Surgical History:  Procedure Laterality Date   APPENDECTOMY      FAMILY HISTORY: No family history on file.  Her father died at age 74 from MI. Her mother died at age 29 from Alzheimer's. Ellen Davenport has 3 brothers and 3 sisters. There is no family history of cancer to her knowledge.   GYNECOLOGIC HISTORY:  No LMP recorded. Menarche: 65 years old Age at first live birth: 65 years old [place] P 2 LMP age 71 Contraceptive: yes HRT never used  Hysterectomy? no BSO? 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 She is separated. She lives at home with her  son Ellen Davenport, age 65, and his son Ellen Davenport, who is 6 years old and works for [employer]. 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 Son Ellen Davenport"), age 41,  works as a Programme researcher, broadcasting/film/video in [place]. Ellen Davenport is Ellen Davenport's only grandchild. Ellen Davenport attends an [employer] in [place]: son Ellen Davenport is her HCPOA.   HEALTH MAINTENANCE:     Colonoscopy: 2022 at [facility]  PAP: 2022  Bone density: date unknown   Not on File  Current Outpatient Medications  Medication Sig Dispense Refill   anastrozole (ARIMIDEX) 1 MG tablet Take 1 tablet (1 mg total) by mouth daily. 90 tablet 4   No current facility-administered medications for this visit.    OBJECTIVE: African-American woman who appears well  There were no vitals filed for this visit.    There is no height or weight on file to calculate BMI.   Wt Readings from Last 3 Encounters:  03/12/21 210 lb (95.3 kg)      ECOG FS:0 - Asymptomatic  Telemedicine visit 03/27/2021  LAB RESULTS:  CMP     Component Value Date/Time   NA 142 03/12/2021 0836   K 3.7 03/12/2021 0836   CL 106 03/12/2021 0836   CO2 25 03/12/2021 0836   GLUCOSE 108 (H) 03/12/2021 0836   BUN 9 03/12/2021 0836   CREATININE 0.77 03/12/2021 0836   CALCIUM 9.2 03/12/2021 0836   PROT 7.9 03/12/2021 0836   ALBUMIN 3.8 03/12/2021 0836   AST 15 03/12/2021 0836   ALT 8 03/12/2021 0836   ALKPHOS 97 03/12/2021 0836   BILITOT 0.6 03/12/2021 0836   GFRNONAA >60 03/12/2021 0836    No results found for: TOTALPROTELP, ALBUMINELP, A1GS, A2GS, BETS, BETA2SER, GAMS, MSPIKE, SPEI  Lab Results  Component Value Date   WBC 5.4 03/12/2021   NEUTROABS 2.6 03/12/2021   HGB 13.0 03/12/2021   HCT 39.5 03/12/2021   MCV 84.4 03/12/2021   PLT 276 03/12/2021    No results found for: LABCA2  No components found for: [id number]  No results for input(s): INR in the last 168 hours.  No results found for: LABCA2  No results found for: WPY099  No results found for: IPJ825  No results found for: KNL976  No results found for: CA2729  No components found for: HGQUANT  No results found for: CEA1 / No results found for:  CEA1   No results found for: AFPTUMOR  No results found for: CHROMOGRNA  No results found for: KPAFRELGTCHN, LAMBDASER, KAPLAMBRATIO (kappa/lambda light chains)  No results found for: HGBA, HGBA2QUANT, HGBFQUANT, HGBSQUAN (Hemoglobinopathy evaluation)   No results found for: LDH  No results found for: IRON, TIBC, IRONPCTSAT (Iron and TIBC)  No results found for: FERRITIN  Urinalysis No results found for: COLORURINE, APPEARANCEUR, LABSPEC, PHURINE, GLUCOSEU, HGBUR, BILIRUBINUR, KETONESUR, PROTEINUR, UROBILINOGEN, NITRITE, LEUKOCYTESUR   STUDIES: No results found.   ELIGIBLE FOR AVAILABLE RESEARCH PROTOCOL: COMET  ASSESSMENT: 65 y.o. [place] woman status post left breast biopsy 02/26/2021 showing ductal carcinoma in situ, grade 2, estrogen and progesterone receptor positive  (1) considering COMET trial versus standard of care  (2) anastrozole started 04/01/2019   PLAN: [place] is considering the Comet study.  She had many questions regarding antiestrogens and we discussed that at length today.  In the end we decided that I would put the prescription in for her and she will start anastrozole.  If she tolerates it well she would be interested in participating in the study.  I have sent a  note to her study nurse to contact her in about 10 days for appropriate follow-up  Total encounter time 15 minutes.Sarajane Jews C. Chiana Wamser, MD 03/27/2021 4:55 PM Medical Oncology and Hematology [facility] [place], [place] [address] Tel. [phone]    Fax. [phone]   This document serves as a record of services personally performed by Lurline Del, MD. It was created on his behalf by Wilburn Mylar, a trained medical scribe. The creation of this record is based on the scribe's personal observations and the provider's statements to them.   I, Lurline Del MD, have reviewed the above documentation for accuracy and completeness, and I agree with  the above.   *Total Encounter Time as defined by the Centers for Medicare and Medicaid Services includes, in addition to the face-to-face time of a patient visit (documented in the note above) non-face-to-face time: obtaining and reviewing outside history, ordering and reviewing medications, tests or procedures, care coordination (communications with other health care professionals or caregivers) and documentation in the medical record.

## 2021-03-28 ENCOUNTER — Telehealth: Payer: Self-pay | Admitting: Emergency Medicine

## 2021-03-28 ENCOUNTER — Encounter: Payer: Self-pay | Admitting: *Deleted

## 2021-03-28 NOTE — Telephone Encounter (Signed)
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 Jana Hakim yesterday.  Patient states she did receive the message from clinical research nurse Doristine Johns yesterday, and is aware that she would need to be enrolled onto the COMET trial prior to starting anastrozole if she would like to participate.  She states that as of now she has not decided whether or not she will participate in the trial.  She denied having any specific questions regarding the trial at this time, but requested more time to consider.  The patient was advised to call with any questions regarding the research study and has been given this coordinator's contact information.  She was informed that research nurse Doristine Johns will plan to call her next Tuesday, 11/8, to follow up regarding her decision.  The patient was thanked for her time.  Clabe Seal Clinical Research Coordinator I  03/28/21  9:39 AM

## 2021-04-01 ENCOUNTER — Telehealth: Payer: Self-pay | Admitting: *Deleted

## 2021-04-01 ENCOUNTER — Encounter: Payer: Self-pay | Admitting: *Deleted

## 2021-04-01 NOTE — Telephone Encounter (Signed)
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  The pt met with Dr. Jana Hakim last Thursday to discuss her treatment options.  Pt was not available to talk on the phone this morning, therefore the nurse left a message at 11:39 am asking her to return the nurse's call at her convenience.  The pt was reminded that her study participation in completely voluntary.  The nurse will await the pt's return call.    Brion Aliment RN, BSN, CCRP Clinical Research Nurse Lead 04/01/2021 11:46 AM

## 2021-04-02 ENCOUNTER — Telehealth: Payer: Self-pay | Admitting: *Deleted

## 2021-04-02 NOTE — Telephone Encounter (Signed)
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  She said that she is still considering participation in the study, but she is not ready to sign the consent yet. 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  The pt requested that the nurse call her around December 1st to discuss the study further.  The pt was told that the study is still expected to be enrolling then.  The study is expected to stop enrolling in late December.  The pt was encouraged to take as much time as she needs in order to feel comfortable with the study.  The pt said that she will have more time to dedicate to the study in December.  The nurse later reviewed the schedule of events table for the study, and the nurse realized that the pt's 03/12/21 H/P will be out of window if the pt proceeds with study in December.  The pt was notified that if she waits until December then she will need to have another H/P in order to meet the study criteria for the COMET study enrollment.  The pt verbalized understanding.  Dr. Jana Hakim and the breast navigators, Bary Castilla and Iris Pert, was notified that the pt wants to "hold off" on the COMET study until December.  The research nurse will call the pt on 04/24/21 to see if she is interested in participation in the COMET study.  Brion Aliment RN, BSN, [place] Clinical Research Nurse Lead 04/02/2021 4:52 PM

## 2021-04-07 ENCOUNTER — Encounter: Payer: Self-pay | Admitting: *Deleted

## 2021-04-24 ENCOUNTER — Inpatient Hospital Stay: Payer: Medicare Other | Admitting: *Deleted

## 2021-04-24 ENCOUNTER — Encounter: Payer: Self-pay | Admitting: *Deleted

## 2021-04-24 ENCOUNTER — Other Ambulatory Visit: Payer: Self-pay | Admitting: *Deleted

## 2021-04-24 ENCOUNTER — Encounter: Payer: Self-pay | Admitting: Adult Health

## 2021-04-24 ENCOUNTER — Inpatient Hospital Stay: Payer: Medicare Other | Attending: Oncology | Admitting: Adult Health

## 2021-04-24 ENCOUNTER — Other Ambulatory Visit: Payer: Self-pay

## 2021-04-24 VITALS — BP 152/77 | HR 88 | Temp 97.7°F | Resp 18 | Ht 68.0 in | Wt 211.6 lb

## 2021-04-24 DIAGNOSIS — D0512 Intraductal carcinoma in situ of left breast: Secondary | ICD-10-CM

## 2021-04-24 DIAGNOSIS — Z79811 Long term (current) use of aromatase inhibitors: Secondary | ICD-10-CM | POA: Diagnosis not present

## 2021-04-24 DIAGNOSIS — M1990 Unspecified osteoarthritis, unspecified site: Secondary | ICD-10-CM | POA: Insufficient documentation

## 2021-04-24 DIAGNOSIS — M519 Unspecified thoracic, thoracolumbar and lumbosacral intervertebral disc disorder: Secondary | ICD-10-CM | POA: Insufficient documentation

## 2021-04-24 DIAGNOSIS — E669 Obesity, unspecified: Secondary | ICD-10-CM | POA: Insufficient documentation

## 2021-04-24 DIAGNOSIS — Z170 Estrogen receptor positive status [ER+]: Secondary | ICD-10-CM | POA: Diagnosis not present

## 2021-04-24 DIAGNOSIS — J309 Allergic rhinitis, unspecified: Secondary | ICD-10-CM | POA: Insufficient documentation

## 2021-04-24 DIAGNOSIS — R7303 Prediabetes: Secondary | ICD-10-CM | POA: Insufficient documentation

## 2021-04-24 DIAGNOSIS — K5730 Diverticulosis of large intestine without perforation or abscess without bleeding: Secondary | ICD-10-CM | POA: Insufficient documentation

## 2021-04-24 DIAGNOSIS — H9313 Tinnitus, bilateral: Secondary | ICD-10-CM | POA: Insufficient documentation

## 2021-04-24 NOTE — Progress Notes (Signed)
[facility]  Telephone:([phone]) [phone] Fax:(336) [phone]     ID: Ellen Davenport DOB: Jan 28, 1956  MR#: [id number]  BPZ#:[id number]  Patient Care Team: Wenda Low, MD as PCP - General (Internal Medicine) Rockwell Germany, RN as Oncology Nurse Navigator Mauro Kaufmann, RN as Oncology Nurse Navigator Magrinat, Virgie Dad, MD as Consulting Physician (Oncology) Erroll Luna, MD as Consulting Physician (General Surgery) Kyung Rudd, MD as Consulting Physician (Radiation Oncology) Scot Dock, NP OTHER MD:  CHIEF COMPLAINT: noninvasive breast cancer, estrogen receptor positive  CURRENT TREATMENT: COMET enrollment pending   HISTORY OF CURRENT ILLNESS: Ellen Davenport was under short-term follow up for probably-benign left breast calcifications. She underwent left diagnostic mammography with tomography at [facility] on 02/05/2021 showing: breast density category B; suspicious 0.5 cm grouped calcifications in upper-outer left breast.  Accordingly on 02/26/2021 she proceeded to biopsy of the left breast area in question. The pathology from this procedure ([id number]) showed: ductal carcinoma in situ with calcifications, intermediate grade. Prognostic indicators significant for: estrogen receptor, 100% positive and progesterone receptor, 100% positive, both with strong staining intensity.    Cancer Staging  Ductal carcinoma in situ (DCIS) of left breast Staging form: Breast, AJCC 8th Edition - Clinical stage from 03/12/2021: Stage 0 (cTis (DCIS), cN0, cM0, ER+, PR+) - Signed by Chauncey Cruel, MD on 03/12/2021 Stage prefix: Initial diagnosis Nuclear grade: G2  The patient's subsequent history is as detailed below.   INTERVAL HISTORY: Ellen Davenport is here today for follow-up of her noninvasive breast cancer.  She is being evaluated for the comet trial.  She is doing well.  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  She says that she is not  particularly active, but now that she is retired she plans to become more active.  Overall she is feeling well.  We reviewed her medications and problem list medical history family history and surgical history in detail.  I updated these in epic.   REVIEW OF SYSTEMS: Review of Systems  Constitutional:  Negative for appetite change, chills, fatigue, fever and unexpected weight change.  HENT:   Negative for hearing loss, lump/mass and trouble swallowing.   Eyes:  Negative for eye problems and icterus.  Respiratory:  Negative for chest tightness, cough and shortness of breath.   Cardiovascular:  Negative for chest pain, leg swelling and palpitations.  Gastrointestinal:  Negative for abdominal distention, abdominal pain, constipation, diarrhea, nausea and vomiting.  Endocrine: Negative for hot flashes.  Genitourinary:  Negative for difficulty urinating.   Musculoskeletal:  Negative for arthralgias.  Skin:  Negative for itching and rash.  Neurological:  Negative for dizziness, extremity weakness, headaches and numbness.  Hematological:  Negative for adenopathy. Does not bruise/bleed easily.  Psychiatric/Behavioral:  Negative for depression. The patient is not nervous/anxious.      COVID 19 VACCINATION STATUS: Moderna x2   PAST MEDICAL HISTORY: Past Medical History:  Diagnosis Date   Breast cancer ([place])     PAST SURGICAL HISTORY: Past Surgical History:  Procedure Laterality Date   APPENDECTOMY     ganglion cyst removal Left     FAMILY HISTORY:   Her father died at age 59 from MI. Her mother died at age 21 from Alzheimer's. Ellen Davenport has 3 brothers and 3 sisters. There is no family history of cancer to her knowledge.   GYNECOLOGIC HISTORY:  No LMP recorded. Menarche: 65 years old Age at first live birth: 65 years old GX P 2  LMP age 23 Contraceptive: yes HRT never used  Hysterectomy? no BSO? 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 She is separated. She lives at home with her son Ellen Davenport, age 40, and his son Ellen Davenport, who is 86 years old and works for [employer]. 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 Son Ellen Davenport"), age 90, works as a Programme researcher, broadcasting/film/video in [place]. Ellen Davenport is Ellen Davenport's only grandchild. Terisa attends an [employer] in [place]: son Ellen Course is her HCPOA.   HEALTH MAINTENANCE: Social History   Tobacco Use   Smoking status: Never   Smokeless tobacco: Never  Substance Use Topics   Alcohol use: Yes    Alcohol/week: 1.0 standard drink    Types: 1 Glasses of wine per week   Drug use: Never     Colonoscopy: 2022 at [place]  PAP: 2022  Bone density: date unknown   Not on File  Current Outpatient Medications  Medication Sig Dispense Refill   Ascorbic Acid (VITAMIN C) 500 MG CAPS Take 1 tablet by mouth daily.     azelastine (ASTELIN) 0.1 % nasal spray Place 1 spray into both nostrils daily as needed for allergies.     fluticasone (FLONASE) 50 MCG/ACT nasal spray Place 1 spray into both nostrils daily as needed.     Multiple Vitamin (MULTIVITAMIN) capsule Take 1 tablet by mouth daily.     olopatadine (PATANOL) 0.1 % ophthalmic solution Place 2 drops into both eyes daily as needed.     Omega 3-6-9 Fatty Acids (OMEGA 3-6-9 COMPLEX PO) Take 1 tablet by mouth daily.     Omega-3 Fatty Acids (FISH OIL) 1000 MG CAPS Take 1 tablet by mouth daily.     Turmeric 500 MG CAPS Take 1 tablet by mouth daily.     anastrozole (ARIMIDEX) 1 MG tablet Take 1 tablet (1 mg total) by mouth daily. 90 tablet 4   cholecalciferol (VITAMIN D3) 25 MCG (1000 UNIT) tablet Take 1,000 Units by mouth daily.     No current facility-administered medications for this visit.    OBJECTIVE: African-American woman who appears well  Vitals:   04/24/21 0826  BP: (!) 152/77  Pulse: 88  Resp: 18  Temp: 97.7 F (36.5 C)  SpO2: 98%     Body mass index is 32.17 kg/m.   Wt Readings from Last 3  Encounters:  04/24/21 211 lb 9.6 oz (96 kg)  03/12/21 210 lb (95.3 kg)      ECOG FS:0 - Asymptomatic GENERAL: Patient is a well appearing female in no acute distress HEENT:  Sclerae anicteric.  Oropharynx clear and moist. No ulcerations or evidence of oropharyngeal candidiasis. Neck is supple.  NODES:  No cervical, supraclavicular, or axillary lymphadenopathy palpated.  BREAST EXAM: Unable to palpate a mass in either breast. LUNGS:  Clear to auscultation bilaterally.  No wheezes or rhonchi. HEART:  Regular rate and rhythm. No murmur appreciated. ABDOMEN:  Soft, nontender.  Positive, normoactive bowel sounds. No organomegaly palpated. MSK:  No focal spinal tenderness to palpation. Full range of motion bilaterally in the upper extremities. EXTREMITIES:  No peripheral edema.   SKIN:  Clear with no obvious rashes or skin changes. No nail dyscrasia. NEURO:  Nonfocal. Well oriented.  Appropriate affect.    LAB RESULTS:  CMP     Component Value Date/Time   NA 142 03/12/2021 0836   K 3.7 03/12/2021 0836   CL  106 03/12/2021 0836   CO2 25 03/12/2021 0836   GLUCOSE 108 (H) 03/12/2021 0836   BUN 9 03/12/2021 0836   CREATININE 0.77 03/12/2021 0836   CALCIUM 9.2 03/12/2021 0836   PROT 7.9 03/12/2021 0836   ALBUMIN 3.8 03/12/2021 0836   AST 15 03/12/2021 0836   ALT 8 03/12/2021 0836   ALKPHOS 97 03/12/2021 0836   BILITOT 0.6 03/12/2021 0836   GFRNONAA >60 03/12/2021 0836     Lab Results  Component Value Date   WBC 5.4 03/12/2021   NEUTROABS 2.6 03/12/2021   HGB 13.0 03/12/2021   HCT 39.5 03/12/2021   MCV 84.4 03/12/2021   PLT 276 03/12/2021      STUDIES: No results found.   ELIGIBLE FOR AVAILABLE RESEARCH PROTOCOL: COMET  ASSESSMENT: 65 y.o. [place] woman status post left breast biopsy 02/26/2021 showing ductal carcinoma in situ, grade 2, estrogen and progesterone receptor positive  (1) considering COMET trial versus standard of care  (2)  antiestrogens  PLAN:  Ellen Davenport is here today for follow-up of her noninvasive breast cancer.  She is considering enrolling in the comet clinical trial.  She spoke with her primary care provider this morning who recommended that she see [facility].  I sent our clinical trial nurse Lexine Baton a message to talk with her, because the enrollment period is ending soon for the clinical trial.  I let Ellen Davenport know that I am happy to refer her to [facility] surgery, that however she wants to proceed moving forward is completely up to her.  She plans to talk to [place], and then will make a decision.  Her follow-up depends on resolution of the above.  She is doing quite well.  Total encounter time 30 minutes.*In face-to-face visit time, chart review, lab review, care coordination, and documentation of the encounter.  Wilber Bihari, NP 04/24/21 8:58 AM Medical Oncology and Hematology [facility] [place], [place] [address] Tel. [phone]    Fax. [phone]    *Total Encounter Time as defined by the Centers for Medicare and Medicaid Services includes, in addition to the face-to-face time of a patient visit (documented in the note above) non-face-to-face time: obtaining and reviewing outside history, ordering and reviewing medications, tests or procedures, care coordination (communications with other health care professionals or caregivers) and documentation in the medical record.

## 2021-04-24 NOTE — Research (Signed)
AFT - 25: COMPARING AN OPERATION TO MONITORING, WITH OR WITHOUT ENDOCRINE THERAPY (COMET) FOR LOW RISK DCIS: A PHASE III PROSPECTIVE RANDOMIZED TRIAL  This Nurse has reviewed this patient's inclusion and exclusion criteria as a second review and confirms Ellen Davenport is eligible for study participation.  Patient may continue with enrollment.  Foye Spurling, BSN, RN, [employer] Clinical Research Nurse 04/24/2021 2:15 PM

## 2021-04-24 NOTE — Research (Addendum)
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 Jana Hakim as a potential candidate for the above listed study.  This Clinical Research Nurse met with Ellen Davenport, [id number] on 04/24/21 in a manner and location that ensures patient privacy to discuss participation in the above listed research study.  Patient is Unaccompanied.  Patient was previously provided with informed consent documents.  She confirmed that she had read the consent form prior to today's appt.  Ellen Davenport, research nurse, was also present during this consent visit.   As outlined in the informed consent form, this Nurse and Ellen Davenport discussed the purpose of the research study, the investigational nature of the study, study procedures and requirements for study participation, potential risks and benefits of study participation, as well as alternatives to participation.  This study is not blinded or double-blinded. The patient understands participation is voluntary and they may withdraw from study participation at any time.  Each study arm was reviewed, and randomization discussed.  This study does not involve an investigational drug or device. This study does not involve a placebo. Patient understands enrollment is pending full eligibility review. The pt's PCP requested that the pt see a surgeon before enrolling into the study.  The pt was informed that she saw Dr. Brantley Stage, breast surgeon, on 03/12/21, and he felt that she was a good candidate for the study.  The pt was also informed that taking endocrine therapy is not mandatory for participation in the study.   Confidentiality and how the patient's information will be used as part of study participation were discussed.  Patient was informed there is not reimbursement provided for their time and effort spent on trial participation.  The  patient is encouraged to discuss research study participation with their insurance provider to determine what costs they may incur as part of study participation, including research related injury.    All questions were answered to patient's satisfaction.  The informed consent with embedded HIPAA language was reviewed page by page.  The patient's mental and emotional status is appropriate to provide informed consent, and the patient verbalizes an understanding of study participation.  Patient has agreed to participate in the above listed research study and has voluntarily signed the informed consent version 01/06/2019 with embedded HIPAA language, version 01/06/2019  on 04/24/21 at 9:49 AM.  The patient was provided with a copy of the signed informed consent form with embedded HIPAA language for their reference.  No study specific procedures were obtained prior to the signing of the informed consent document.  Approximately 50 minutes was spent with the patient reviewing the informed consent documents.  After obtaining informed consent patient, voluntarily signed the optional Release of Information form for use throughout trial participation. The pt was then asked some baseline study questions.  The pt denies using hormone replacement therapy in the past or currently.  The pt confirmed that she has not taken any anastrozole.  The pt said that she did not even pick up the prescription for the anastrozole.  She confirmed that she had 2 full term pregnancies, and the age at her first full term pregnancy was 65 years old.  She said that she has 1 daughter and 1 son.  The pt was given the baseline questionnaire to complete, and the research nurse, Ellen Davenport, reviewed the completed questionnaire for completeness and accuracy.  The pt was seen this morning by Dr. Virgie Dad NP, [place] Phlegm, to discuss her treatment options.  Menopausal status (women only): Ellen Davenport is post menopausal, and she reports her   LMP occurred when she was 64 years old (year 2005, month/date are unknown).  Baseline comorbidities were reviewed with the patient.  She denies a history of the following conditions:   HTN, COPD, renal problems, stroke, and MI.  She states she does have pre-diabetes and seasonal allergies.  The pt agreed to have her baseline research blood drawn on 05/05/21.  The pt was informed that she will be notified once she has been registered/randomized into the study.  The pt had no further questions or concerns for the research nurse.  This Nurse has reviewed this patient's inclusion and exclusion criteria and confirmed Ellen Davenport is eligible for study participation.  Patient will continue with enrollment. The nurse requested a 2nd nurse review of the eligibility criteria and the stratification factors. Dr.Magrinat was notified that the pt has signed consent to participate in the COMET study, and he confirms that she meets all inclusion/exclusion criteria for enrollment.  The research nurse will proceed with study registration/randomization.   Brion Aliment RN, BSN, CCRP Clinical Research Nurse Lead 04/24/2021 10:27 AM

## 2021-04-24 NOTE — Progress Notes (Signed)
AFT - 25: COMPARING AN OPERATION TO MONITORING, WITH OR WITHOUT ENDOCRINE THERAPY (COMET) FOR LOW RISK DCIS: A PHASE III PROSPECTIVE RANDOMIZED TRIAL   [name], was registered to the above listed study, assigned study# [id number]. Randomization is required for this study. Randomization information to follow.    [name] is randomized to Arm 1- Surgery.  Stratification criteria were confirmed by this clinical research Nurse and clinical research Nurse, Foye Spurling verified the stratification factors used for randomization.  As part of the assigned treatment arm, [name] will receive Surgery. MD will be notified of patient assignment; her surgery will be scheduled within 60 days of randomization. The research nurse will also notify the breast navigators of this pt's randomization for surgical scheduling planning. Per study protocol, [name] will be notified of her treatment assignment. [name] is successfully enrolled in the above study.   Brion Aliment RN, BSN, CCRP Clinical Research Nurse Lead 04/24/2021 4:39 PM

## 2021-04-28 ENCOUNTER — Ambulatory Visit: Payer: Self-pay | Admitting: Surgery

## 2021-04-28 DIAGNOSIS — D0512 Intraductal carcinoma in situ of left breast: Secondary | ICD-10-CM

## 2021-04-30 ENCOUNTER — Telehealth: Payer: Self-pay | Admitting: Radiation Oncology

## 2021-04-30 ENCOUNTER — Encounter: Payer: Self-pay | Admitting: *Deleted

## 2021-04-30 NOTE — Telephone Encounter (Signed)
I called the patient after we were notified she had some questions after randomizing to the treatment arm of the COMET trial. She is still deciding how she'd like to proceed. She is actually thinking she wants to forgo surgery even though she would not be in the randomized arm of the trial. I suggested she also ask Dr. Jana Hakim about this. I reviewed the rationale for external radiotherapy to the breast  to reduce risks of local recurrence followed by antiestrogen therapy. We discussed the risks, benefits, short, and long term effects of radiotherapy, as well as the curative intent, and the patient is going to think about her options. If she does undergo breast concerving surgery, we would expect a 4 week course of radiation and would anticipate starting radiotherapy about 4-6 weeks after surgery.

## 2021-05-01 ENCOUNTER — Telehealth: Payer: Self-pay | Admitting: *Deleted

## 2021-05-01 NOTE — Telephone Encounter (Signed)
AFT - 25: COMPARING AN OPERATION TO MONITORING, WITH OR WITHOUT ENDOCRINE THERAPY (COMET) FOR LOW RISK DCIS: A PHASE III PROSPECTIVE RANDOMIZED TRIAL   The pt called the research nurse this morning and stated after much consideration, she has decided to NOT have surgery (her assigned randomized treatment arm).  She said that she felt that she was going to get the [place] arm, and she just did not want surgery.  She said that she met with Dr. Brantley Stage this week to discuss the surgery, but she feels that the best option for her is to just monitor the DCIS and hold off on her surgery for now.  The nurse explained that she can still participate in the study.  The pt was explained that her data will be reviewed differently, but her data will still be included.  The pt agreed to remain in the study.  She was in agreement to have her research blood drawn on 05/05/21.  The pt wishes to be followed as an active monitoring patient.  The pt was thanked for remaining in the study. The research nurse notified Dr. Jana Hakim and the breast navigators, Bary Castilla and Iris Pert, about the pt's decision to not have surgery.  The pt will be seen by Dr. Jana Hakim to finalize her treatment plan.  The research nurse contacted Loney Loh, COMET study manager, about the pt's decision to not accept her randomized treatment assignment.  The research nurse updated the study database with information about the pt's decision to not have surgery.  Brion Aliment RN, BSN, CCRP Clinical Research Nurse Lead 05/01/2021 4:14 PM

## 2021-05-02 ENCOUNTER — Telehealth: Payer: Self-pay | Admitting: Oncology

## 2021-05-02 NOTE — Telephone Encounter (Signed)
S cheduled per sch msg. Called and was not able to leave msg.

## 2021-05-05 ENCOUNTER — Inpatient Hospital Stay: Payer: Medicare Other

## 2021-05-05 ENCOUNTER — Inpatient Hospital Stay: Payer: Medicare Other | Admitting: *Deleted

## 2021-05-05 ENCOUNTER — Encounter: Payer: Self-pay | Admitting: *Deleted

## 2021-05-05 ENCOUNTER — Other Ambulatory Visit: Payer: Self-pay

## 2021-05-05 DIAGNOSIS — D0512 Intraductal carcinoma in situ of left breast: Secondary | ICD-10-CM

## 2021-05-05 LAB — RESEARCH LABS

## 2021-05-05 NOTE — Progress Notes (Signed)
[facility] [facility]  Clinical Social Work was referred by medical oncology to review and complete healthcare advance directives.  Clinical Social Worker and [employer] met with patient in Advance Directives clinic.  The patient designated Teacher, early years/pre as their primary healthcare agent and Doxie Augenstein as their secondary agent.  Patient also completed healthcare living will.    Clinical Social Worker notarized documents and made copies for patient/family. Clinical Social Worker will send documents to medical records to be scanned into patient's chart. Clinical Social Worker encouraged patient/family to contact with any additional questions or concerns.   Johnnye Lana, MSW, LCSW, OSW-C Clinical Social Worker [facility] [phone]

## 2021-05-06 NOTE — Progress Notes (Signed)
[place]  [phone] Fax:(336) [phone]     ID: Ellen Davenport DOB: [birth year]  MR#: [id number]  PRF#:[id number]  Patient Care Team: Wenda Low, MD as PCP - General (Internal Medicine) Rockwell Germany, RN as Oncology Nurse Navigator Mauro Kaufmann, RN as Oncology Nurse Navigator Anayla Giannetti, Virgie Dad, MD as Consulting Physician (Oncology) Erroll Luna, MD as Consulting Physician (General Surgery) Kyung Rudd, MD as Consulting Physician (Radiation Oncology) Chauncey Cruel, MD OTHER MD:  CHIEF COMPLAINT: noninvasive breast cancer, estrogen receptor positive  CURRENT TREATMENT: observation on COMET trial   INTERVAL HISTORY: Ellen Davenport returns today for follow-up of her noninvasive breast cancer. She opted to be followed with observation under the COMET trial.  On 03/18/2021 she had a right breast screening mammogram which showed the breast density to be category B.  There was no evidence of malignancy.  Bone density 05/10/2019 showed a T score of -0.5   REVIEW OF SYSTEMS: Ellen Davenport tells me she is just starting a walking program.  She has walked 2 days in the past week.  She walked about 20 minutes at a time.  She also has silver sneakers and she tells me she is planning to start using that option as well.  A detailed review of systems was otherwise stable.   COVID 19 VACCINATION STATUS: Moderna x2   HISTORY OF CURRENT ILLNESS: From the original intake note:  Ellen Davenport was under short-term follow up for probably-benign left breast calcifications. She underwent left diagnostic mammography with tomography at [facility] on 02/05/2021 showing: breast density category B; suspicious 0.5 cm grouped calcifications in upper-outer left breast.  Accordingly on 02/26/2021 she proceeded to biopsy of the left breast area in question. The pathology from this procedure ([id number]) showed: ductal carcinoma in situ with calcifications, intermediate grade. Prognostic  indicators significant for: estrogen receptor, 100% positive and progesterone receptor, 100% positive, both with strong staining intensity.    Cancer Staging  Ductal carcinoma in situ (DCIS) of left breast Staging form: Breast, AJCC 8th Edition - Clinical stage from 03/12/2021: Stage 0 (cTis (DCIS), cN0, cM0, ER+, PR+) - Signed by Chauncey Cruel, MD on 03/12/2021 Stage prefix: Initial diagnosis Nuclear grade: G2  The patient's subsequent history is as detailed below.   PAST MEDICAL HISTORY: Past Medical History:  Diagnosis Date   Breast cancer ([place])     PAST SURGICAL HISTORY: Past Surgical History:  Procedure Laterality Date   APPENDECTOMY     ganglion cyst removal Left     FAMILY HISTORY: Family History  Problem Relation Age of Onset   Dementia Mother    Heart attack Father    Cancer Neg Hx   Her father died at age 79 from MI. Her mother died at age 19 from Alzheimer's. Ellen Davenport has 3 brothers and 3 sisters. There is no family history of cancer to her knowledge.   GYNECOLOGIC HISTORY:  No LMP recorded. Menarche: 65 years old Age at first live birth: 65 years old [place] P 2 LMP age 40 Contraceptive: yes HRT never used  Hysterectomy? no BSO? 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 She is separated. She lives at home with her son Ellen Davenport, age 34, and his son Ellen Davenport, who is 69 years old and works for [employer]. 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 Son Ellen Davenport [place]"), age 15, works as a Programme researcher, broadcasting/film/video in [place]. Ellen Davenport is Ellen Davenport's only grandchild. Evea attends an  AME church in [facility] [place].    ADVANCED DIRECTIVES: son Ellen Davenport is her 75.   HEALTH MAINTENANCE: Social History   Tobacco Use   Smoking status: Never   Smokeless tobacco: Never  Substance Use Topics   Alcohol use: Yes    Alcohol/week: 1.0 standard drink    Types: 1 Glasses of wine per week   Drug use: Never     Colonoscopy:  2022 at [place]  PAP: 2022  Bone density: date unknown   Not on File  Current Outpatient Medications  Medication Sig Dispense Refill   anastrozole (ARIMIDEX) 1 MG tablet Take 1 tablet (1 mg total) by mouth daily. 90 tablet 4   Ascorbic Acid (VITAMIN C) 500 MG CAPS Take 1 tablet by mouth daily.     azelastine (ASTELIN) 0.1 % nasal spray Place 1 spray into both nostrils daily as needed for allergies.     cholecalciferol (VITAMIN D3) 25 MCG (1000 UNIT) tablet Take 1,000 Units by mouth daily.     fluticasone (FLONASE) 50 MCG/ACT nasal spray Place 1 spray into both nostrils daily as needed.     Multiple Vitamin (MULTIVITAMIN) capsule Take 1 tablet by mouth daily.     olopatadine (PATANOL) 0.1 % ophthalmic solution Place 2 drops into both eyes daily as needed.     Omega 3-6-9 Fatty Acids (OMEGA 3-6-9 COMPLEX PO) Take 1 tablet by mouth daily.     Omega-3 Fatty Acids (FISH OIL) 1000 MG CAPS Take 1 tablet by mouth daily.     Turmeric 500 MG CAPS Take 1 tablet by mouth daily.     No current facility-administered medications for this visit.    OBJECTIVE: African-American woman who appears well  Vitals:   05/07/21 1650  BP: (!) 143/72  Pulse: 80  Resp: 18  Temp: 97.7 F (36.5 C)  SpO2: 99%     Body mass index is 32.46 kg/m.   Wt Readings from Last 3 Encounters:  05/07/21 213 lb 8 oz (96.8 kg)  04/24/21 211 lb 9.6 oz (96 kg)  03/12/21 210 lb (95.3 kg)     ECOG FS:0 - Asymptomatic  Sclerae unicteric, EOMs intact Wearing a mask No cervical or supraclavicular adenopathy Lungs no rales or rhonchi Heart regular rate and rhythm Abd soft, nontender, positive bowel sounds MSK no focal spinal tenderness, no upper extremity lymphedema Neuro: nonfocal, well oriented, appropriate affect Breasts: The right breast is unremarkable.  I do not palpate any masses in the left breast.  There are no skin or nipple changes of concern.  Both axillae are benign.   LAB RESULTS:  CMP     Component  Value Date/Time   NA 142 03/12/2021 0836   K 3.7 03/12/2021 0836   CL 106 03/12/2021 0836   CO2 25 03/12/2021 0836   GLUCOSE 108 (H) 03/12/2021 0836   BUN 9 03/12/2021 0836   CREATININE 0.77 03/12/2021 0836   CALCIUM 9.2 03/12/2021 0836   PROT 7.9 03/12/2021 0836   ALBUMIN 3.8 03/12/2021 0836   AST 15 03/12/2021 0836   ALT 8 03/12/2021 0836   ALKPHOS 97 03/12/2021 0836   BILITOT 0.6 03/12/2021 0836   GFRNONAA >60 03/12/2021 0836    Lab Results  Component Value Date   WBC 5.4 03/12/2021   NEUTROABS 2.6 03/12/2021   HGB 13.0 03/12/2021   HCT 39.5 03/12/2021   MCV 84.4 03/12/2021   PLT 276 03/12/2021    STUDIES: No results found.   ELIGIBLE FOR AVAILABLE RESEARCH PROTOCOL: COMET  ASSESSMENT: 65 y.o. [place] woman status post left breast biopsy 02/26/2021 showing ductal carcinoma in situ, grade 2, estrogen and progesterone receptor positive  (1) enrolled in COMET trial, observation arm  (2) anastrozole started 05/13/2021  (A) bone density 05/10/2019 at [place] showed a T score of -0.5   PLAN: Sherry enrolled in the COMET study and is in the observation arm.  She had her labs drawn 2 days ago.  She is now ready to start anastrozole.  We discussed the possible toxicities side effects and complications of this agent.  Certainly if she has any significant problems with hot flashes or vaginal dryness she will let us know.  She had a bone density in December 2020.  We are going to repeat that in October 2023 after she has been on anastrozole 1 year  She will have a left breast mammogram in April and then bilateral mammography in October 2023 and we will continue that pattern while she is under study unless we get a different directive from the study nurses.  She will then see [place] May 2023 for her next visit  Total encounter time 20 minutes.Sarajane Jews C. Diem Dicocco, MD 05/07/21 4:53 PM Medical Oncology and Hematology [facility] [place], [place] [address] Tel. [phone]    Fax. [phone]   I, Wilburn Mylar, am acting as scribe for Dr. Virgie Dad. Elisabet Gutzmer.  I, Lurline Del MD, have reviewed the above documentation for accuracy and completeness, and I agree with the above.    *Total Encounter Time as defined by the Centers for Medicare and Medicaid Services includes, in addition to the face-to-face time of a patient visit (documented in the note above) non-face-to-face time: obtaining and reviewing outside history, ordering and reviewing medications, tests or procedures, care coordination (communications with other health care professionals or caregivers) and documentation in the medical record.

## 2021-05-07 ENCOUNTER — Other Ambulatory Visit: Payer: Self-pay

## 2021-05-07 ENCOUNTER — Inpatient Hospital Stay (HOSPITAL_BASED_OUTPATIENT_CLINIC_OR_DEPARTMENT_OTHER): Payer: Medicare Other | Admitting: Oncology

## 2021-05-07 VITALS — BP 143/72 | HR 80 | Temp 97.7°F | Resp 18 | Ht 68.0 in | Wt 213.5 lb

## 2021-05-07 DIAGNOSIS — D0512 Intraductal carcinoma in situ of left breast: Secondary | ICD-10-CM

## 2021-05-07 MED ORDER — ANASTROZOLE 1 MG PO TABS: 1.0000 mg | ORAL_TABLET | Freq: Every day | ORAL | 4 refills | Status: DC

## 2021-05-07 NOTE — Addendum Note (Signed)
Addended by: Chauncey Cruel on: 05/07/2021 05:13 PM   Modules accepted: Orders

## 2021-05-08 ENCOUNTER — Encounter: Payer: Self-pay | Admitting: *Deleted

## 2021-05-30 ENCOUNTER — Telehealth: Payer: Self-pay | Admitting: Hematology and Oncology

## 2021-05-30 NOTE — Telephone Encounter (Signed)
Scheduled per 01/05 scheduled message, patient has been called and notified. °

## 2021-06-16 ENCOUNTER — Other Ambulatory Visit: Payer: Self-pay

## 2021-06-16 ENCOUNTER — Other Ambulatory Visit: Payer: Self-pay | Admitting: Internal Medicine

## 2021-06-16 ENCOUNTER — Ambulatory Visit
Admission: RE | Admit: 2021-06-16 | Discharge: 2021-06-16 | Disposition: A | Discharge status: Home / Self Care | Payer: Medicare PPO | Source: Ambulatory Visit | Attending: Internal Medicine | Admitting: Internal Medicine

## 2021-06-16 DIAGNOSIS — M25561 Pain in right knee: Secondary | ICD-10-CM

## 2021-09-01 DIAGNOSIS — M1711 Unilateral primary osteoarthritis, right knee: Secondary | ICD-10-CM | POA: Diagnosis not present

## 2021-09-10 DIAGNOSIS — M1711 Unilateral primary osteoarthritis, right knee: Secondary | ICD-10-CM | POA: Diagnosis not present

## 2021-09-22 ENCOUNTER — Telehealth: Payer: Self-pay | Admitting: Hematology and Oncology

## 2021-09-22 NOTE — Telephone Encounter (Signed)
.  Called patient to schedule appointment per need to r/s per prov template, patient is aware of date and time.   ?

## 2021-10-02 ENCOUNTER — Inpatient Hospital Stay: Payer: Medicare PPO | Admitting: Hematology and Oncology

## 2021-10-02 ENCOUNTER — Inpatient Hospital Stay: Payer: Medicare PPO

## 2021-10-07 DIAGNOSIS — R928 Other abnormal and inconclusive findings on diagnostic imaging of breast: Secondary | ICD-10-CM | POA: Diagnosis not present

## 2021-10-13 ENCOUNTER — Other Ambulatory Visit: Payer: Self-pay

## 2021-10-13 DIAGNOSIS — D0512 Intraductal carcinoma in situ of left breast: Secondary | ICD-10-CM

## 2021-10-14 ENCOUNTER — Other Ambulatory Visit: Payer: Self-pay

## 2021-10-14 ENCOUNTER — Encounter: Payer: Self-pay | Admitting: *Deleted

## 2021-10-14 ENCOUNTER — Inpatient Hospital Stay: Payer: Medicare PPO | Admitting: Hematology and Oncology

## 2021-10-14 ENCOUNTER — Inpatient Hospital Stay: Payer: Medicare PPO | Attending: Hematology and Oncology

## 2021-10-14 ENCOUNTER — Encounter: Payer: Self-pay | Admitting: Hematology and Oncology

## 2021-10-14 VITALS — BP 161/88 | HR 76 | Temp 97.9°F | Resp 16 | Ht 68.0 in | Wt 218.6 lb

## 2021-10-14 DIAGNOSIS — Z006 Encounter for examination for normal comparison and control in clinical research program: Secondary | ICD-10-CM

## 2021-10-14 DIAGNOSIS — D0512 Intraductal carcinoma in situ of left breast: Secondary | ICD-10-CM

## 2021-10-14 DIAGNOSIS — Z170 Estrogen receptor positive status [ER+]: Secondary | ICD-10-CM | POA: Insufficient documentation

## 2021-10-14 LAB — CBC WITH DIFFERENTIAL (CANCER CENTER ONLY)
Abs Immature Granulocytes: 0.02 10*3/uL (ref 0.00–0.07)
Basophils Absolute: 0 10*3/uL (ref 0.0–0.1)
Basophils Relative: 0 %
Eosinophils Absolute: 0.1 10*3/uL (ref 0.0–0.5)
Eosinophils Relative: 2 %
HCT: 39 % (ref 36.0–46.0)
Hemoglobin: 13.1 g/dL (ref 12.0–15.0)
Immature Granulocytes: 0 %
Lymphocytes Relative: 50 %
Lymphs Abs: 2.7 10*3/uL (ref 0.7–4.0)
MCH: 28 pg (ref 26.0–34.0)
MCHC: 33.6 g/dL (ref 30.0–36.0)
MCV: 83.3 fL (ref 80.0–100.0)
Monocytes Absolute: 0.5 10*3/uL (ref 0.1–1.0)
Monocytes Relative: 9 %
Neutro Abs: 2.1 10*3/uL (ref 1.7–7.7)
Neutrophils Relative %: 39 %
Platelet Count: 304 10*3/uL (ref 150–400)
RBC: 4.68 MIL/uL (ref 3.87–5.11)
RDW: 13.2 % (ref 11.5–15.5)
WBC Count: 5.3 10*3/uL (ref 4.0–10.5)
nRBC: 0 % (ref 0.0–0.2)

## 2021-10-14 LAB — CMP (CANCER CENTER ONLY)
ALT: 7 U/L (ref 0–44)
AST: 13 U/L — ABNORMAL LOW (ref 15–41)
Albumin: 4 g/dL (ref 3.5–5.0)
Alkaline Phosphatase: 87 U/L (ref 38–126)
Anion gap: 6 (ref 5–15)
BUN: 11 mg/dL (ref 8–23)
CO2: 28 mmol/L (ref 22–32)
Calcium: 8.9 mg/dL (ref 8.9–10.3)
Chloride: 106 mmol/L (ref 98–111)
Creatinine: 0.74 mg/dL (ref 0.44–1.00)
GFR, Estimated: >60 mL/min (ref >60)
Glucose, Bld: 97 mg/dL (ref 70–99)
Potassium: 3.8 mmol/L (ref 3.5–5.1)
Sodium: 140 mmol/L (ref 135–145)
Total Bilirubin: 0.5 mg/dL (ref 0.3–1.2)
Total Protein: 7.6 g/dL (ref 6.5–8.1)

## 2021-10-14 MED ORDER — TAMOXIFEN CITRATE 20 MG PO TABS
20.0000 mg | ORAL_TABLET | Freq: Every day | ORAL | 1 refills | Status: DC
Start: 2021-10-14 — End: 2022-05-24

## 2021-10-14 NOTE — Progress Notes (Signed)
Ellen Davenport  Telephone:(336) [phone] Fax:(336) [phone]     ID: Ellen Davenport DOB: 06-08-1955  MR#: [id number]  WFU#:[id number]  Patient Care Team: Wenda Low, MD as PCP - General (Internal Medicine) Rockwell Germany, RN as Oncology Nurse Navigator Mauro Kaufmann, RN as Oncology Nurse Navigator Magrinat, Virgie Dad, MD (Inactive) as Consulting Physician (Oncology) Erroll Luna, MD as Consulting Physician (General Surgery) Kyung Rudd, MD as Consulting Physician (Radiation Oncology) Benay Pike, MD OTHER MD:  CHIEF COMPLAINT: noninvasive breast cancer, estrogen receptor positive  CURRENT TREATMENT: observation on COMET trial  INTERVAL HISTORY: Ellen Davenport returns today for follow-up of her noninvasive breast cancer. She opted to be followed with observation under the COMET trial.  She tried anastrozole for 1 month and could not tolerated because of intractable hot flashes.  She just had a mammogram in [place], no change in her DCIS.  She denies any other new complaints.  She has some left knee osteoarthritis and baseline arthralgia but no new symptoms. Bone density 05/10/2019 showed a T score of -0.5 Rest of the pertinent 10 point ROS reviewed and negative    COVID 19 VACCINATION STATUS: Moderna x2   HISTORY OF CURRENT ILLNESS: From the original intake note:  Ellen Davenport was under short-term follow up for probably-benign left breast calcifications. She underwent left diagnostic mammography with tomography at [facility] on 02/05/2021 showing: breast density category B; suspicious 0.5 cm grouped calcifications in upper-outer left breast.  Accordingly on 02/26/2021 she proceeded to biopsy of the left breast area in question. The pathology from this procedure ([id number]) showed: ductal carcinoma in situ with calcifications, intermediate grade. Prognostic indicators significant for: estrogen receptor, 100% positive and progesterone receptor, 100% positive, both with strong  staining intensity.    Cancer Staging  Ductal carcinoma in situ (DCIS) of left breast Staging form: Breast, AJCC 8th Edition - Clinical stage from 03/12/2021: Stage 0 (cTis (DCIS), cN0, cM0, ER+, PR+) - Signed by Chauncey Cruel, MD on 03/12/2021 Stage prefix: Initial diagnosis Nuclear grade: G2   The patient's subsequent history is as detailed below.   PAST MEDICAL HISTORY: Past Medical History:  Diagnosis Date   Breast cancer ([place])     PAST SURGICAL HISTORY: Past Surgical History:  Procedure Laterality Date   APPENDECTOMY     ganglion cyst removal Left     FAMILY HISTORY: Family History  Problem Relation Age of Onset   Dementia Mother    Heart attack Father    Cancer Neg Hx   Her father died at age 55 from MI. Her mother died at age 37 from Alzheimer's. Ellen Davenport has 3 brothers and 3 sisters. There is no family history of cancer to her knowledge.   GYNECOLOGIC HISTORY:  No LMP recorded. Menarche: 66 years old Age at first live birth: 66 years old [place] P 2 LMP age 35 Contraceptive: yes HRT never used  Hysterectomy? no BSO? 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 She lives at home with her son Ellen Davenport, age 68, and his son Ellen Davenport, who is 74 years old and works for [employer]. 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 Son Ellen Davenport [place]"), age 68, works as a Programme researcher, broadcasting/film/video in [place]. Ellen Davenport is Renaye's only grandchild. Tiffony attends an [employer] in [place]: son Ellen Davenport is her HCPOA.   HEALTH MAINTENANCE: Social History   Tobacco Use   Smoking status: Never  Smokeless tobacco: Never  Substance Use Topics   Alcohol use: Yes    Alcohol/week: 1.0 standard drink    Types: 1 Glasses of wine per week   Drug use: Never     Colonoscopy: 2022 at [place]  PAP: 2022  Bone density: date unknown   Not on File  Current Outpatient Medications  Medication  Sig Dispense Refill   anastrozole (ARIMIDEX) 1 MG tablet Take 1 tablet (1 mg total) by mouth daily. 90 tablet 4   Ascorbic Acid (VITAMIN C) 500 MG CAPS Take 1 tablet by mouth daily.     azelastine (ASTELIN) 0.1 % nasal spray Place 1 spray into both nostrils daily as needed for allergies.     cholecalciferol (VITAMIN D3) 25 MCG (1000 UNIT) tablet Take 1,000 Units by mouth daily.     fluticasone (FLONASE) 50 MCG/ACT nasal spray Place 1 spray into both nostrils daily as needed.     Multiple Vitamin (MULTIVITAMIN) capsule Take 1 tablet by mouth daily.     olopatadine (PATANOL) 0.1 % ophthalmic solution Place 2 drops into both eyes daily as needed.     Omega 3-6-9 Fatty Acids (OMEGA 3-6-9 COMPLEX PO) Take 1 tablet by mouth daily.     Omega-3 Fatty Acids (FISH OIL) 1000 MG CAPS Take 1 tablet by mouth daily.     Turmeric 500 MG CAPS Take 1 tablet by mouth daily.     No current facility-administered medications for this visit.    OBJECTIVE: African-American woman who appears well  Vitals:   10/14/21 1134  BP: (!) 161/88  Pulse: 76  Resp: 16  Temp: 97.9 F (36.6 C)  SpO2: 97%     Body mass index is 33.24 kg/m.   Wt Readings from Last 3 Encounters:  10/14/21 218 lb 9.6 oz (99.2 kg)  05/07/21 213 lb 8 oz (96.8 kg)  04/24/21 211 lb 9.6 oz (96 kg)     ECOG FS:0 - Asymptomatic  Sclerae unicteric, EOMs intact Wearing a mask No cervical or supraclavicular adenopathy Lungs no rales or rhonchi Heart regular rate and rhythm Abd soft, nontender, positive bowel sounds MSK no focal spinal tenderness, no upper extremity lymphedema Neuro: nonfocal, well oriented, appropriate affect Breasts: Bilateral breasts inspected.  No palpable masses or regional lymphadenopathy  LAB RESULTS:  CMP     Component Value Date/Time   NA 142 03/12/2021 0836   K 3.7 03/12/2021 0836   CL 106 03/12/2021 0836   CO2 25 03/12/2021 0836   GLUCOSE 108 (H) 03/12/2021 0836   BUN 9 03/12/2021 0836   CREATININE 0.77  03/12/2021 0836   CALCIUM 9.2 03/12/2021 0836   PROT 7.9 03/12/2021 0836   ALBUMIN 3.8 03/12/2021 0836   AST 15 03/12/2021 0836   ALT 8 03/12/2021 0836   ALKPHOS 97 03/12/2021 0836   BILITOT 0.6 03/12/2021 0836   GFRNONAA >60 03/12/2021 0836    Lab Results  Component Value Date   WBC 5.3 10/14/2021   NEUTROABS 2.1 10/14/2021   HGB 13.1 10/14/2021   HCT 39.0 10/14/2021   MCV 83.3 10/14/2021   PLT 304 10/14/2021    STUDIES: No results found.   ELIGIBLE FOR AVAILABLE RESEARCH PROTOCOL: COMET  ASSESSMENT: 66 y.o. [place] woman status post left breast biopsy 02/26/2021 showing ductal carcinoma in situ, grade 2, estrogen and progesterone receptor positive  (1) enrolled in COMET trial, observation arm  (2) anastrozole started 05/13/2021  (A) bone density 05/10/2019 at [place] showed a T score of -0.5 Discontinued after  1 month   PLAN: Sherry enrolled in the COMET study and is in the observation arm.   She tried anastrozole for about a month and stopped it because of hot flashes which kept her awake during the night.  She is here for follow-up and denies any new changes.  She recently had a mammogram at [facility] which according to the patient showed no change, will also obtain a copy of the mammogram report from [place]. Physical examination today no changes in the breast.  No new palpable masses or regional lymphadenopathy. We have today discussed about tamoxifen, mechanism of action, adverse effects including but not limited to postmenopausal symptoms/DVT/PE/endometrial cancer.  A benefit from tamoxifen would be improvement in bone density in addition to reducing risk of DCIS progression.  She is agreeable to trying tamoxifen, this has been prescribed to the pharmacy of her choice.  She was encouraged to return to clinic in 3 months.  She was encouraged to contact us if she decides to stop the medication for any reason and she expressed understanding.  Total encounter time 30  minutes.*   *Total Encounter Time as defined by the Centers for Medicare and Medicaid Services includes, in addition to the face-to-face time of a patient visit (documented in the note above) non-face-to-face time: obtaining and reviewing outside history, ordering and reviewing medications, tests or procedures, care coordination (communications with other health care professionals or caregivers) and documentation in the medical record.

## 2021-10-14 NOTE — Research (Signed)
AFT - 25: COMPARING AN OPERATION TO MONITORING, WITH OR WITHOUT ENDOCRINE THERAPY (COMET) FOR LOW RISK DCIS: A PHASE III PROSPECTIVE RANDOMIZED TRIAL   Patient [facility] today@ the [facility] Unaccompanied for her 6 month visit.     PROs: The pt was informed that she will need to complete her 6 month questionnaires when the study emails them to her around 10/23/21.  The pt knows that she will have several weeks to complete them online.    LABS: Dr. Chryl Heck ordered [facility] labs today.  No research sample was due at this visit.  The pt was informed that her annual research samples will be drawn at her month 12 visit in December 2023.   Imaging:  The pt states she had a mammogram done at [facility] last week.  She states she was told that there were "no changes".  Will obtain pt's mammogram and submit to the study.     MEDICATION REVIEW: Patient reviews and verifies the current medication list is not correct.   She states that she stopped her anastrozole 1 month after she started taking it (stopped ~06/13/21)  The pt said that it caused "hot flashes" which were bothersome to her at night.  Pt reports her hot flashes are ongoing, and she awakens once at night due to her hot flashes. Reported changes were recorded on the medication list and marked as reviewed. All other listed medications remain the same.  VITAL SIGNS: Vital signs are collected per study protocol.  MD/PROVIDER VISIT: Patient sees Dr. Chryl Heck for today's visit.  This is her first visit with this MD since Dr. Virgie Dad retirement.     ADVERSE EVENTS: [name] reports the following Solicited AE's are ongoing: arthralgia and hot flashes.  She denied all of the other Solicited AE's.  Research nurse met with Dr. Chryl Heck after the pt's visit to discuss attributions related to her Solicited AE's.  Dr. Chryl Heck said that the pt's arthralgia (mainly involving pt's knees) is "unlikely" related to the pt's short treatment with anastrozole.  Dr. Chryl Heck states that the  pt's hot flashes are "probably" related to the anastrozole.    ADVERSE EVENT LOGMadigan Davenport [id number]  10/14/2021  Adverse Event Log  Study/Protocol: COMET/ FMM-03 6 month Solicited AE's   Event Grade Onset Date Resolved Date Drug Name Attribution Treatment Comments  Arthralgia Grade 2 06/16/21 (date of knee xray) Ongoing  anastrozole Unlikely Pt saw orthopedic doctor and received injections for relief Pt states her knee pain has improved some over the past months since stopped the anastrozole   Hot flashes  Grade 2 unknown Ongoing  Anastrozole  Probably None    Not evaluated Solicited AE's include the following:  acute coronary syndrome, ischemia cerebrovascular, osteoporosis, and cholesterol.    DISPOSITION: Upon completion off all study requirements, patient was escorted to the scheduler for her future appts. Dr. Chryl Heck wants to see the pt in 3 months to see if she is tolerated tamoxifen.    The patient was thanked for their time and continued voluntary participation in this study. [name] has been provided direct contact information and is encouraged to contact this Nurse for any needs or questions.  Brion Aliment RN, BSN, CCRP Clinical Research Nurse Lead 10/14/2021 2:56 PM

## 2021-10-17 ENCOUNTER — Telehealth: Payer: Self-pay | Admitting: Hematology and Oncology

## 2021-10-17 NOTE — Telephone Encounter (Signed)
Scheduled appointment per 5/23 notes. Patient is aware.

## 2022-01-19 ENCOUNTER — Other Ambulatory Visit: Payer: Self-pay

## 2022-01-19 ENCOUNTER — Encounter: Payer: Self-pay | Admitting: Hematology and Oncology

## 2022-01-19 ENCOUNTER — Inpatient Hospital Stay: Payer: Medicare PPO | Attending: Hematology and Oncology | Admitting: Hematology and Oncology

## 2022-01-19 ENCOUNTER — Other Ambulatory Visit: Payer: Medicare PPO

## 2022-01-19 VITALS — BP 143/84 | HR 87 | Temp 97.8°F | Resp 18 | Ht 68.0 in | Wt 219.9 lb

## 2022-01-19 DIAGNOSIS — D0512 Intraductal carcinoma in situ of left breast: Secondary | ICD-10-CM | POA: Insufficient documentation

## 2022-01-19 NOTE — Progress Notes (Signed)
Ellen Davenport  Telephone:([phone] Fax:(336) [phone]     ID: Ellen Davenport DOB: [birth year]  MR#: [id number]  YTK#:[id number]  Patient Care Team: Ellen Low, MD as PCP - General (Internal Medicine) Ellen Germany, RN as Oncology Nurse Navigator Ellen Kaufmann, RN as Oncology Nurse Navigator Ellen Luna, MD as Consulting Physician (General Surgery) Ellen Rudd, MD as Consulting Physician (Radiation Oncology) Ellen Pike, MD as Medical Oncologist (Hematology and Oncology) Ellen Pike, MD OTHER MD:  CHIEF COMPLAINT: noninvasive breast cancer, estrogen receptor positive  CURRENT TREATMENT: observation on COMET trial  INTERVAL HISTORY:  Ellen Davenport returns today for follow-up of her noninvasive breast cancer. She opted to be followed with observation under the COMET trial.   Bone density 05/10/2019 showed a T score of -0.5 She is now on tamoxifen.  She is tolerating this very well.  No vaginal bleeding, lower extremity swelling or shortness of breath.  No breast changes reported. Rest of the pertinent 10 point ROS reviewed and negative    COVID 19 VACCINATION STATUS: Moderna x2   HISTORY OF CURRENT ILLNESS: From the original intake note:  Ellen Davenport was under short-term follow up for probably-benign left breast calcifications. She underwent left diagnostic mammography with tomography at [facility] on 02/05/2021 showing: breast density category B; suspicious 0.5 cm grouped calcifications in upper-outer left breast.  Accordingly on 02/26/2021 she proceeded to biopsy of the left breast area in question. The pathology from this procedure ([id number]) showed: ductal carcinoma in situ with calcifications, intermediate grade. Prognostic indicators significant for: estrogen receptor, 100% positive and progesterone receptor, 100% positive, both with strong staining intensity.    Cancer Staging  Ductal carcinoma in situ (DCIS) of left breast Staging form: Breast, AJCC  8th Edition - Clinical stage from 03/12/2021: Stage 0 (cTis (DCIS), cN0, cM0, ER+, PR+) - Signed by Chauncey Cruel, MD on 03/12/2021 Stage prefix: Initial diagnosis Nuclear grade: G2   The patient's subsequent history is as detailed below.   PAST MEDICAL HISTORY: Past Medical History:  Diagnosis Date   Breast cancer ([place])     PAST SURGICAL HISTORY: Past Surgical History:  Procedure Laterality Date   APPENDECTOMY     ganglion cyst removal Left     FAMILY HISTORY: Family History  Problem Relation Age of Onset   Dementia Mother    Heart attack Father    Cancer Neg Hx   Her father died at age 76 from MI. Her mother died at age 53 from Alzheimer's. Ellen Davenport has 3 brothers and 3 sisters. There is no family history of cancer to her knowledge.   GYNECOLOGIC HISTORY:  No LMP recorded. Menarche: 66 years old Age at first live birth: 66 years old [place] P 2 LMP age 40 Contraceptive: yes HRT never used  Hysterectomy? no BSO? 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 She lives at home with her son Ellen Davenport, age 25, and his son Ellen Davenport, who is 32 years old and works for [employer]. 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 Son Ellen Davenport"), age 37, works as a Programme researcher, broadcasting/film/video in [place]. Ellen Davenport is Ellen Davenport's only grandchild. Ellen Davenport attends an [employer] in [place]: son Ellen Course is her HCPOA.   HEALTH MAINTENANCE: Social History   Tobacco Use   Smoking status: Never   Smokeless tobacco: Never  Substance Use Topics   Alcohol use: Yes    Alcohol/week: 1.0  standard drink of alcohol    Types: 1 Glasses of wine per week   Drug use: Never     Colonoscopy: 2022 at [place]  PAP: 2022  Bone density: date unknown   Not on File  Current Outpatient Medications  Medication Sig Dispense Refill   Ascorbic Acid (VITAMIN C) 500 MG CAPS Take 1 tablet by mouth daily.     azelastine  (ASTELIN) 0.1 % nasal spray Place 1 spray into both nostrils daily as needed for allergies.     cholecalciferol (VITAMIN D3) 25 MCG (1000 UNIT) tablet Take 1,000 Units by mouth daily.     fluticasone (FLONASE) 50 MCG/ACT nasal spray Place 1 spray into both nostrils daily as needed.     Multiple Vitamin (MULTIVITAMIN) capsule Take 1 tablet by mouth daily.     olopatadine (PATANOL) 0.1 % ophthalmic solution Place 2 drops into both eyes daily as needed.     Omega 3-6-9 Fatty Acids (OMEGA 3-6-9 COMPLEX PO) Take 1 tablet by mouth daily.     Omega-3 Fatty Acids (FISH OIL) 1000 MG CAPS Take 1 tablet by mouth daily.     tamoxifen (NOLVADEX) 20 MG tablet Take 1 tablet (20 mg total) by mouth daily. 90 tablet 1   Turmeric 500 MG CAPS Take 1 tablet by mouth daily.     No current facility-administered medications for this visit.    OBJECTIVE: African-American woman who appears well  Vitals:   01/19/22 1159  BP: (!) 143/84  Pulse: 87  Resp: 18  Temp: 97.8 F (36.6 C)  SpO2: 96%     Body mass index is 33.44 kg/m.   Wt Readings from Last 3 Encounters:  01/19/22 219 lb 14.4 oz (99.7 kg)  10/14/21 218 lb 9.6 oz (99.2 kg)  05/07/21 213 lb 8 oz (96.8 kg)     ECOG FS:0 - Asymptomatic  Physical Exam Constitutional:      Appearance: Normal appearance.  Cardiovascular:     Rate and Rhythm: Normal rate and regular rhythm.  Pulmonary:     Effort: Pulmonary effort is normal.     Breath sounds: Normal breath sounds.  Chest:     Comments: Bilateral breasts inspected.  No palpable masses or regional adenopathy. Abdominal:     General: Abdomen is flat.     Palpations: Abdomen is soft.  Musculoskeletal:     Cervical back: Normal range of motion and neck supple. No rigidity.  Lymphadenopathy:     Cervical: No cervical adenopathy.  Neurological:     General: No focal deficit present.     Mental Status: She is alert.  Psychiatric:        Mood and Affect: Mood normal.      LAB  RESULTS:  CMP     Component Value Date/Time   NA 140 10/14/2021 1119   K 3.8 10/14/2021 1119   CL 106 10/14/2021 1119   CO2 28 10/14/2021 1119   GLUCOSE 97 10/14/2021 1119   BUN 11 10/14/2021 1119   CREATININE 0.74 10/14/2021 1119   CALCIUM 8.9 10/14/2021 1119   PROT 7.6 10/14/2021 1119   ALBUMIN 4.0 10/14/2021 1119   AST 13 (L) 10/14/2021 1119   ALT 7 10/14/2021 1119   ALKPHOS 87 10/14/2021 1119   BILITOT 0.5 10/14/2021 1119   GFRNONAA >60 10/14/2021 1119    Lab Results  Component Value Date   WBC 5.3 10/14/2021   NEUTROABS 2.1 10/14/2021   HGB 13.1 10/14/2021   HCT 39.0 10/14/2021  MCV 83.3 10/14/2021   PLT 304 10/14/2021   No new or residual calcifications or change at site of previously biopsied coarse heterogeneous calcifications which were intermediate grade DCIS. Continued surveillance per COMET trial protocol is recommended. Annual screening of the contralateral right breast will be due in October 2023. A follow-up mammogram in 6 months is recommended  STUDIES: No results found.   ELIGIBLE FOR AVAILABLE RESEARCH PROTOCOL: COMET  ASSESSMENT: 66 y.o. [place] woman status post left breast biopsy 02/26/2021 showing ductal carcinoma in situ, grade 2, estrogen and progesterone receptor positive  (1) enrolled in COMET trial, observation arm  (2) anastrozole started 05/13/2021  (A) bone density 05/10/2019 at [place] showed a T score of -0.5 Discontinued after 1 month    PLAN:  Ellen Davenport enrolled in the COMET study and is in the observation arm.   She tried anastrozole for about a month and stopped it because of hot flashes which kept her awake during the night.  During the last visit we have discussed about tamoxifen, side effects and she was willing to give it a try.  She is here for follow-up on tamoxifen.  She is tolerating this very well.  She has 1 hot flash every night which is entirely tolerable.  Last mammogram in May 2023 with no new or residual  calcifications or change at site of previously biopsied coarse heterogeneous calcifications which were intermediate grade DCIS.  Continued surveillance per comet trial protocol recommended.  Follow-up mammogram in 6 months was recommended for the breast.  She is due for annual bilateral mammogram in November which has been ordered. Physical examination today without any concerns. She will return to clinic per protocol.  Total encounter time 30 minutes.*   *Total Encounter Time as defined by the Centers for Medicare and Medicaid Services includes, in addition to the face-to-face time of a patient visit (documented in the note above) non-face-to-face time: obtaining and reviewing outside history, ordering and reviewing medications, tests or procedures, care coordination (communications with other health care professionals or caregivers) and documentation in the medical record.

## 2022-01-20 ENCOUNTER — Other Ambulatory Visit: Payer: Self-pay | Admitting: *Deleted

## 2022-01-20 DIAGNOSIS — D0512 Intraductal carcinoma in situ of left breast: Secondary | ICD-10-CM

## 2022-03-03 DIAGNOSIS — H43393 Other vitreous opacities, bilateral: Secondary | ICD-10-CM | POA: Diagnosis not present

## 2022-04-21 DIAGNOSIS — R921 Mammographic calcification found on diagnostic imaging of breast: Secondary | ICD-10-CM | POA: Diagnosis not present

## 2022-04-21 DIAGNOSIS — Z853 Personal history of malignant neoplasm of breast: Secondary | ICD-10-CM | POA: Diagnosis not present

## 2022-04-28 ENCOUNTER — Inpatient Hospital Stay: Payer: Medicare PPO | Attending: Hematology and Oncology | Admitting: Hematology and Oncology

## 2022-04-28 ENCOUNTER — Other Ambulatory Visit: Payer: Self-pay

## 2022-04-28 ENCOUNTER — Encounter: Payer: Self-pay | Admitting: *Deleted

## 2022-04-28 ENCOUNTER — Inpatient Hospital Stay: Payer: Medicare PPO

## 2022-04-28 VITALS — BP 147/89 | HR 72 | Temp 97.7°F | Resp 16 | Ht 68.0 in | Wt 220.6 lb

## 2022-04-28 DIAGNOSIS — Z79810 Long term (current) use of selective estrogen receptor modulators (SERMs): Secondary | ICD-10-CM | POA: Diagnosis not present

## 2022-04-28 DIAGNOSIS — D0512 Intraductal carcinoma in situ of left breast: Secondary | ICD-10-CM

## 2022-04-28 DIAGNOSIS — Z006 Encounter for examination for normal comparison and control in clinical research program: Secondary | ICD-10-CM | POA: Diagnosis not present

## 2022-04-28 LAB — RESEARCH LABS

## 2022-04-28 NOTE — Research (Signed)
AFT - 25: COMPARING AN OPERATION TO MONITORING, WITH OR WITHOUT ENDOCRINE THERAPY (COMET) FOR LOW RISK DCIS: A PHASE III PROSPECTIVE RANDOMIZED TRIAL   Patient arrives today@ the [facility] unaccompanied for her 12 month visit.     PROs: The pt completed her month 12 questionnaires online on 04/27/22.  The pt was thanked for completing her questionnaires in a timely manner.     LABS: The pt's annual research samples were drawn today.  Pt had no complications from the blood collection.  Samples will be shipped today.     Imaging:  The pt's mammogram was done at [place] on 04/21/22. Mammogram impression stated "No new or residual calcifications or change at site of previously biopsied coarse heterogeneous calcifications which were intermediate grade DCIS".  Next imaging to be done in 6 months per protocol.    MEDICATION REVIEW: Patient reviewed current medication list.  The pt said that she takes her tamoxifen "almost daily".  She said that she is tolerating her tamoxifen well.     VITAL SIGNS: Vital signs collected per study protocol.   MD/PROVIDER VISIT: Patient saw Dr. Chryl Heck for today's visit.  Refer to Dr. Rob Hickman note for the H/P.    ADVERSE EVENTS: [name] reports the following Solicited AE's as ongoing: mild arthralgia (grade 1) and hot flashes (grade 1).  She denied all of the other Solicited AE's.  Research nurse met with Dr. Chryl Heck after the pt's visit to discuss attributions related to her Solicited AE's.  Dr. Chryl Heck said that the pt's arthralgia (mainly involving pt's knees) is "unrelated" to her tamoxifen because it was present before she began taking tamoxifen.  Dr. Chryl Heck states that the pt's mild 1 hot flash/day is "likely" (recorded as probable for study reporting purposes) related to tamoxifen.   ADVERSE EVENT LOGMarguarite Davenport [id number]   04/28/2022   Adverse Event Log   Study/Protocol: COMET/ [id number] 12 month Solicited AE's    Event Grade Onset Date Resolved  Date Drug Name Attribution Treatment Comments  Arthralgia Grade 1 06/16/21  Ongoing  Tamoxifen Unrelated  Pt reports AE as "mild"  Hot flashes  Grade 1 unknown Ongoing  Tamoxifen  Probable None   1 flash/day  Not evaluated Solicited AE's include the following:  acute coronary syndrome, ischemia cerebrovascular, osteoporosis, and cholesterol.     DISPOSITION: Upon completion off all study requirements, patient was escorted to the scheduler for her future appts. Dr. Chryl Heck wants to see pt in 6 months for her month 18 visit.    The patient was thanked for their time and continued voluntary participation in this study. [name] has been provided direct contact information and is encouraged to contact this Nurse for any needs or questions.  Brion Aliment RN, BSN, CCRP Clinical Research Nurse Lead 04/28/2022 1:44 PM

## 2022-04-28 NOTE — Progress Notes (Unsigned)
[place]  Telephone:(336) [phone] Fax:(336) [phone]     ID: Denyse Fillion DOB: 01-19-1956  MR#: [id number]  OHY#:[id number]  Patient Care Team: Wenda Low, MD as PCP - General (Internal Medicine) Rockwell Germany, RN as Oncology Nurse Navigator Mauro Kaufmann, RN as Oncology Nurse Navigator Erroll Luna, MD as Consulting Physician (General Surgery) Kyung Rudd, MD as Consulting Physician (Radiation Oncology) Benay Pike, MD as Medical Oncologist (Hematology and Oncology) Benay Pike, MD OTHER MD:  CHIEF COMPLAINT: noninvasive breast cancer, estrogen receptor positive  CURRENT TREATMENT: observation on COMET trial  INTERVAL HISTORY:  Larayah returns today for follow-up of her noninvasive breast cancer. She opted to be followed with observation under the COMET trial.   Bone density 05/10/2019 showed a T score of -0.5 She is now on tamoxifen.  She is tolerating this very well.  No vaginal bleeding, lower extremity swelling or shortness of breath.  No breast changes reported. Rest of the pertinent 10 point ROS reviewed and negative    COVID 19 VACCINATION STATUS: Moderna x2   HISTORY OF CURRENT ILLNESS: From the original intake note:  Lexus Moncada was under short-term follow up for probably-benign left breast calcifications. She underwent left diagnostic mammography with tomography at [facility] on 02/05/2021 showing: breast density category B; suspicious 0.5 cm grouped calcifications in upper-outer left breast.  Accordingly on 02/26/2021 she proceeded to biopsy of the left breast area in question. The pathology from this procedure ([id number]) showed: ductal carcinoma in situ with calcifications, intermediate grade. Prognostic indicators significant for: estrogen receptor, 100% positive and progesterone receptor, 100% positive, both with strong staining intensity.    Cancer Staging  Ductal carcinoma in situ (DCIS) of left breast Staging form: Breast, AJCC  8th Edition - Clinical stage from 03/12/2021: Stage 0 (cTis (DCIS), cN0, cM0, ER+, PR+) - Signed by Chauncey Cruel, MD on 03/12/2021 Stage prefix: Initial diagnosis Nuclear grade: G2   The patient's subsequent history is as detailed below.   PAST MEDICAL HISTORY: Past Medical History:  Diagnosis Date   Breast cancer ([place])     PAST SURGICAL HISTORY: Past Surgical History:  Procedure Laterality Date   APPENDECTOMY     ganglion cyst removal Left     FAMILY HISTORY: Family History  Problem Relation Age of Onset   Dementia Mother    Heart attack Father    Cancer Neg Hx   Her father died at age 4 from MI. Her mother died at age 52 from Alzheimer's. Lydie has 3 brothers and 3 sisters. There is no family history of cancer to her knowledge.   GYNECOLOGIC HISTORY:  No LMP recorded. Menarche: 66 years old Age at first live birth: 66 years old [place] P 2 LMP age 28 Contraceptive: yes HRT never used  Hysterectomy? no BSO? 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 She lives at home with her son [name], age 78, and his son [name], who is 92 years old and works for [employer]. [name] works as a Health and safety inspector. Son [name] [place]"), age 66, works as a Programme researcher, broadcasting/film/video in [place]. [name] is Alexsandria's only grandchild. Khylee attends an [employer] in [place]: son [name] is her HCPOA.   HEALTH MAINTENANCE: Social History   Tobacco Use   Smoking status: Never   Smokeless tobacco: Never  Substance Use Topics   Alcohol use: Yes    Alcohol/week: 1.0  standard drink of alcohol    Types: 1 Glasses of wine per week   Drug use: Never     Colonoscopy: 2022 at [place]  PAP: 2022  Bone density: date unknown   Not on File  Current Outpatient Medications  Medication Sig Dispense Refill   Ascorbic Acid (VITAMIN C) 500 MG CAPS Take 1 tablet by mouth daily.     azelastine  (ASTELIN) 0.1 % nasal spray Place 1 spray into both nostrils daily as needed for allergies.     cholecalciferol (VITAMIN D3) 25 MCG (1000 UNIT) tablet Take 1,000 Units by mouth daily.     fluticasone (FLONASE) 50 MCG/ACT nasal spray Place 1 spray into both nostrils daily as needed.     Multiple Vitamin (MULTIVITAMIN) capsule Take 1 tablet by mouth daily.     olopatadine (PATANOL) 0.1 % ophthalmic solution Place 2 drops into both eyes daily as needed.     Omega 3-6-9 Fatty Acids (OMEGA 3-6-9 COMPLEX PO) Take 1 tablet by mouth daily.     Omega-3 Fatty Acids (FISH OIL) 1000 MG CAPS Take 1 tablet by mouth daily.     tamoxifen (NOLVADEX) 20 MG tablet Take 1 tablet (20 mg total) by mouth daily. 90 tablet 1   Turmeric 500 MG CAPS Take 1 tablet by mouth daily.     No current facility-administered medications for this visit.    OBJECTIVE: African-American woman who appears well  Vitals:   04/28/22 1157  BP: (!) 147/89  Pulse: 72  Resp: 16  Temp: 97.7 F (36.5 C)  SpO2: 98%     Body mass index is 33.54 kg/m.   Wt Readings from Last 3 Encounters:  04/28/22 220 lb 9.6 oz (100.1 kg)  01/19/22 219 lb 14.4 oz (99.7 kg)  10/14/21 218 lb 9.6 oz (99.2 kg)     ECOG FS:0 - Asymptomatic  Physical Exam Constitutional:      Appearance: Normal appearance.  Cardiovascular:     Rate and Rhythm: Normal rate and regular rhythm.  Pulmonary:     Effort: Pulmonary effort is normal.     Breath sounds: Normal breath sounds.  Chest:     Comments: Bilateral breasts inspected.  No palpable masses or regional adenopathy. Abdominal:     General: Abdomen is flat.     Palpations: Abdomen is soft.  Musculoskeletal:     Cervical back: Normal range of motion and neck supple. No rigidity.  Lymphadenopathy:     Cervical: No cervical adenopathy.  Neurological:     General: No focal deficit present.     Mental Status: She is alert.  Psychiatric:        Mood and Affect: Mood normal.      LAB  RESULTS:  CMP     Component Value Date/Time   NA 140 10/14/2021 1119   K 3.8 10/14/2021 1119   CL 106 10/14/2021 1119   CO2 28 10/14/2021 1119   GLUCOSE 97 10/14/2021 1119   BUN 11 10/14/2021 1119   CREATININE 0.74 10/14/2021 1119   CALCIUM 8.9 10/14/2021 1119   PROT 7.6 10/14/2021 1119   ALBUMIN 4.0 10/14/2021 1119   AST 13 (L) 10/14/2021 1119   ALT 7 10/14/2021 1119   ALKPHOS 87 10/14/2021 1119   BILITOT 0.5 10/14/2021 1119   GFRNONAA >60 10/14/2021 1119    Lab Results  Component Value Date   WBC 5.3 10/14/2021   NEUTROABS 2.1 10/14/2021   HGB 13.1 10/14/2021   HCT 39.0 10/14/2021  MCV 83.3 10/14/2021   PLT 304 10/14/2021   No new or residual calcifications or change at site of previously biopsied coarse heterogeneous calcifications which were intermediate grade DCIS. Continued surveillance per COMET trial protocol is recommended. Annual screening of the contralateral right breast will be due in October 2023. A follow-up mammogram in 6 months is recommended  STUDIES: No results found.   ELIGIBLE FOR AVAILABLE RESEARCH PROTOCOL: COMET  ASSESSMENT: 66 y.o. [place] woman status post left breast biopsy 02/26/2021 showing ductal carcinoma in situ, grade 2, estrogen and progesterone receptor positive  (1) enrolled in COMET trial, observation arm  (2) anastrozole started 05/13/2021  (A) bone density 05/10/2019 at [place] showed a T score of -0.5 Discontinued after 1 month    PLAN:  Sherry enrolled in the COMET study and is in the observation arm and Tamoxifen. She started Tamoxifen about May 2023. She is tolerating it really well, no adverse effects except for one hot flash a day which is likely from Tamoxifen.  She will return to clinic per protocol.  Total encounter time 30 minutes.*   *Total Encounter Time as defined by the Centers for Medicare and Medicaid Services includes, in addition to the face-to-face time of a patient visit (documented in the note  above) non-face-to-face time: obtaining and reviewing outside history, ordering and reviewing medications, tests or procedures, care coordination (communications with other health care professionals or caregivers) and documentation in the medical record.

## 2022-04-29 ENCOUNTER — Encounter: Payer: Self-pay | Admitting: Hematology and Oncology

## 2022-05-15 ENCOUNTER — Encounter: Payer: Self-pay | Admitting: Hematology and Oncology

## 2022-05-24 ENCOUNTER — Other Ambulatory Visit: Payer: Self-pay | Admitting: Hematology and Oncology

## 2022-05-24 NOTE — Telephone Encounter (Signed)
PER LAST OV, continue anastrozole. Gardiner Rhyme, RN

## 2022-07-29 ENCOUNTER — Encounter: Payer: Self-pay | Admitting: Internal Medicine

## 2022-07-29 DIAGNOSIS — R7303 Prediabetes: Secondary | ICD-10-CM | POA: Diagnosis not present

## 2022-07-29 DIAGNOSIS — J309 Allergic rhinitis, unspecified: Secondary | ICD-10-CM | POA: Diagnosis not present

## 2022-07-29 DIAGNOSIS — Z1389 Encounter for screening for other disorder: Secondary | ICD-10-CM | POA: Diagnosis not present

## 2022-07-29 DIAGNOSIS — M1990 Unspecified osteoarthritis, unspecified site: Secondary | ICD-10-CM | POA: Diagnosis not present

## 2022-07-29 DIAGNOSIS — E559 Vitamin D deficiency, unspecified: Secondary | ICD-10-CM | POA: Diagnosis not present

## 2022-07-29 DIAGNOSIS — E785 Hyperlipidemia, unspecified: Secondary | ICD-10-CM | POA: Diagnosis not present

## 2022-07-29 DIAGNOSIS — M519 Unspecified thoracic, thoracolumbar and lumbosacral intervertebral disc disorder: Secondary | ICD-10-CM | POA: Diagnosis not present

## 2022-07-29 DIAGNOSIS — Z0000 Encounter for general adult medical examination without abnormal findings: Secondary | ICD-10-CM | POA: Diagnosis not present

## 2022-07-29 DIAGNOSIS — D0510 Intraductal carcinoma in situ of unspecified breast: Secondary | ICD-10-CM | POA: Diagnosis not present

## 2022-07-30 ENCOUNTER — Other Ambulatory Visit: Payer: Self-pay | Admitting: Internal Medicine

## 2022-07-30 DIAGNOSIS — D0510 Intraductal carcinoma in situ of unspecified breast: Secondary | ICD-10-CM

## 2022-10-21 DIAGNOSIS — Z853 Personal history of malignant neoplasm of breast: Secondary | ICD-10-CM | POA: Diagnosis not present

## 2022-10-21 DIAGNOSIS — Z08 Encounter for follow-up examination after completed treatment for malignant neoplasm: Secondary | ICD-10-CM | POA: Diagnosis not present

## 2022-10-26 ENCOUNTER — Encounter: Payer: Self-pay | Admitting: *Deleted

## 2022-10-26 DIAGNOSIS — D0512 Intraductal carcinoma in situ of left breast: Secondary | ICD-10-CM

## 2022-10-26 NOTE — Research (Unsigned)
AFT - 25: COMPARING AN OPERATION TO MONITORING, WITH OR WITHOUT ENDOCRINE THERAPY (COMET) FOR LOW RISK DCIS: A PHASE III PROSPECTIVE RANDOMIZED TRIAL    Research nurse called the patient to discuss her health status on the COMET study.  The pt is scheduled to see Dr. Al Pimple tomorrow in the clinic, and the nurse will not be able to meet with the pt in the clinic.     PROs: There are no required questionnaires to complete for this month 18 visit.    LABS: There are no required research blood to be drawn for this month 18 visit.    Imaging:  The pt's mammogram was done at [facility] on 10/21/22. Mammogram impression stated "No change in appearance of site of biopsy proven intermediate grade DCIS in the 3 o'clock location left breast, middle depth.  Scattered benign calcifications throughout the breast appear stable. No significant masses, distortion, or other findings in either breast."  Next imaging to be done in 6 months per protocol.    MEDICATION REVIEW: Patient reviewed current medication list.  The pt said that she takes her tamoxifen "almost daily".  She said that she is tolerating her tamoxifen well.     MD/PROVIDER VISIT: Patient scheduled to see Dr. Al Pimple on 10/27/22.  Will follow up with MD after pt's month 18 visit for reporting purposes and solicited adverse event attribution.     ADVERSE EVENTS: [name] reports the following Solicited AE's as ongoing: mild arthralgia (grade 1) and hot flashes (grade 1).  She denied all of the other Solicited AE's. The pt said that her most recent Bone Density test was normal.    ADVERSE EVENT LOGJeila Davenport [id number]   10/26/2022   Adverse Event Log   Study/Protocol: COMET/ AFT-25 18 month Solicited AE's    Event Grade Onset Date Resolved Date Drug Name Attribution Treatment Comments  Arthralgia Grade 1 06/16/21  Ongoing  Tamoxifen Unrelated   Pt reports AE as "mild"  Hot flashes  Grade 1 unknown Ongoing  Tamoxifen  Probable  None   1 flash/day  Not evaluated Solicited AE's include the following:  acute coronary syndrome, ischemia cerebrovascular, osteoporosis, and cholesterol.      The patient was thanked for their time and continued voluntary participation in this study. The pt is aware that her next study visit will be due in December 2024 for her month 24 visit with research labs and questionnaires.  [name] has been provided direct contact information and is encouraged to contact this Nurse for any needs or questions.   Janan Ridge RN, BSN, CCRP Clinical Research Nurse Lead 04/28/2022 1:44 PM

## 2022-10-27 ENCOUNTER — Other Ambulatory Visit: Payer: Self-pay

## 2022-10-27 ENCOUNTER — Inpatient Hospital Stay: Payer: Medicare PPO | Attending: Hematology and Oncology | Admitting: Hematology and Oncology

## 2022-10-27 ENCOUNTER — Other Ambulatory Visit: Payer: Medicare PPO

## 2022-10-27 VITALS — BP 141/65 | HR 83 | Temp 97.2°F | Resp 18 | Ht 68.0 in | Wt 217.6 lb

## 2022-10-27 DIAGNOSIS — Z79810 Long term (current) use of selective estrogen receptor modulators (SERMs): Secondary | ICD-10-CM | POA: Diagnosis not present

## 2022-10-27 DIAGNOSIS — D0512 Intraductal carcinoma in situ of left breast: Secondary | ICD-10-CM | POA: Diagnosis not present

## 2022-10-27 DIAGNOSIS — Z170 Estrogen receptor positive status [ER+]: Secondary | ICD-10-CM | POA: Insufficient documentation

## 2022-10-27 DIAGNOSIS — Z006 Encounter for examination for normal comparison and control in clinical research program: Secondary | ICD-10-CM | POA: Insufficient documentation

## 2022-10-27 NOTE — Progress Notes (Signed)
[facility]  Telephone:(336) [phone] Fax:(336) [phone]     ID: Ellen Davenport DOB: [birth year]  MR#: [id number]  GEX#:[id number]  Patient Care Team: Georgann Housekeeper, MD as PCP - General (Internal Medicine) Donnelly Angelica, RN as Oncology Nurse Navigator Pershing Proud, RN as Oncology Nurse Navigator Harriette Bouillon, MD as Consulting Physician (General Surgery) Dorothy Puffer, MD as Consulting Physician (Radiation Oncology) Rachel Moulds, MD as Medical Oncologist (Hematology and Oncology) Rachel Moulds, MD  CHIEF COMPLAINT: noninvasive breast cancer, estrogen receptor positive  CURRENT TREATMENT: observation on COMET trial  INTERVAL HISTORY:  Eddie returns today for follow-up of her noninvasive breast cancer.  She opted to be followed with observation under the COMET trial.   She is now on tamoxifen.  She is tolerating this very well.  No vaginal bleeding, lower extremity swelling or shortness of breath.  No breast changes reported.  She reports mild hot flashes with tamoxifen.  She does not think the arthralgias are related because she had these even prior to using tamoxifen. Rest of the pertinent 10 point ROS reviewed and negative   COVID 19 VACCINATION STATUS: Moderna x2   HISTORY OF CURRENT ILLNESS: From the original intake note:  Ellen Davenport was under short-term follow up for probably-benign left breast calcifications. She underwent left diagnostic mammography with tomography at [facility] on 02/05/2021 showing: breast density category B; suspicious 0.5 cm grouped calcifications in upper-outer left breast.  Accordingly on 02/26/2021 she proceeded to biopsy of the left breast area in question. The pathology from this procedure ([id number]) showed: ductal carcinoma in situ with calcifications, intermediate grade. Prognostic indicators significant for: estrogen receptor, 100% positive and progesterone receptor, 100% positive, both with strong staining intensity.     Cancer Staging  Ductal carcinoma in situ (DCIS) of left breast Staging form: Breast, AJCC 8th Edition - Clinical stage from 03/12/2021: Stage 0 (cTis (DCIS), cN0, cM0, ER+, PR+) - Signed by Lowella Dell, MD on 03/12/2021 Stage prefix: Initial diagnosis Nuclear grade: G2   The patient's subsequent history is as detailed below.   PAST MEDICAL HISTORY: Past Medical History:  Diagnosis Date   Breast cancer (HCC)     PAST SURGICAL HISTORY: Past Surgical History:  Procedure Laterality Date   APPENDECTOMY     ganglion cyst removal Left     FAMILY HISTORY: Family History  Problem Relation Age of Onset   Dementia Mother    Heart attack Father    Cancer Neg Hx   Her father died at age 68 from MI. Her mother died at age 56 from Alzheimer's. Ellen Davenport has 3 brothers and 3 sisters. There is no family history of cancer to her knowledge.   GYNECOLOGIC HISTORY:  No LMP recorded. Menarche: 67 years old Age at first live birth: 67 years old GX P 2 LMP age 67 Contraceptive: yes HRT never used  Hysterectomy? no BSO? 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 She lives at home with her son [name], age 89, and his son [name], who is 11 years old and works for [employer]. [name] works as a Art therapist. Son Chase Picket [place] age 89, works as a Retail banker in [place]. [name] is Kasyn's only grandchild. Telina attends an [employer] in [place].    ADVANCED DIRECTIVES: son Chase Picket is her HCPOA.   HEALTH MAINTENANCE: Social History   Tobacco Use   Smoking status: Never   Smokeless tobacco:  Never  Substance Use Topics   Alcohol use: Yes    Alcohol/week: 1.0 standard drink of alcohol    Types: 1 Glasses of wine per week   Drug use: Never     Colonoscopy: 2022 at [place]  PAP: 2022  Bone density: date unknown   Not on File  Current Outpatient Medications  Medication Sig Dispense  Refill   Ascorbic Acid (VITAMIN C) 500 MG CAPS Take 1 tablet by mouth daily.     azelastine (ASTELIN) 0.1 % nasal spray Place 1 spray into both nostrils daily as needed for allergies.     cholecalciferol (VITAMIN D3) 25 MCG (1000 UNIT) tablet Take 1,000 Units by mouth daily.     fluticasone (FLONASE) 50 MCG/ACT nasal spray Place 1 spray into both nostrils daily as needed.     Multiple Vitamin (MULTIVITAMIN) capsule Take 1 tablet by mouth daily.     olopatadine (PATANOL) 0.1 % ophthalmic solution Place 2 drops into both eyes daily as needed.     Omega 3-6-9 Fatty Acids (OMEGA 3-6-9 COMPLEX PO) Take 1 tablet by mouth daily.     Omega-3 Fatty Acids (FISH OIL) 1000 MG CAPS Take 1 tablet by mouth daily.     tamoxifen (NOLVADEX) 20 MG tablet TAKE 1 TABLET BY MOUTH EVERY DAY 90 tablet 1   Turmeric 500 MG CAPS Take 1 tablet by mouth daily.     No current facility-administered medications for this visit.    OBJECTIVE: African-American woman who appears well  Vitals:   10/27/22 1151  BP: (!) 141/65  Pulse: 83  Resp: 18  Temp: (!) 97.2 F (36.2 C)  SpO2: 100%     Body mass index is 33.09 kg/m.   Wt Readings from Last 3 Encounters:  10/27/22 217 lb 9.6 oz (98.7 kg)  04/28/22 220 lb 9.6 oz (100.1 kg)  01/19/22 219 lb 14.4 oz (99.7 kg)     ECOG FS:0 - Asymptomatic  Physical Exam Constitutional:      Appearance: Normal appearance.  Cardiovascular:     Rate and Rhythm: Normal rate and regular rhythm.  Pulmonary:     Effort: Pulmonary effort is normal.     Breath sounds: Normal breath sounds.  Chest:     Comments: Bilateral breasts inspected.  No palpable masses or regional adenopathy. Abdominal:     General: Abdomen is flat.     Palpations: Abdomen is soft.  Musculoskeletal:     Cervical back: Normal range of motion and neck supple. No rigidity.  Lymphadenopathy:     Cervical: No cervical adenopathy.  Neurological:     General: No focal deficit present.     Mental Status: She is  alert.  Psychiatric:        Mood and Affect: Mood normal.      LAB RESULTS:  CMP     Component Value Date/Time   NA 140 10/14/2021 1119   K 3.8 10/14/2021 1119   CL 106 10/14/2021 1119   CO2 28 10/14/2021 1119   GLUCOSE 97 10/14/2021 1119   BUN 11 10/14/2021 1119   CREATININE 0.74 10/14/2021 1119   CALCIUM 8.9 10/14/2021 1119   PROT 7.6 10/14/2021 1119   ALBUMIN 4.0 10/14/2021 1119   AST 13 (L) 10/14/2021 1119   ALT 7 10/14/2021 1119   ALKPHOS 87 10/14/2021 1119   BILITOT 0.5 10/14/2021 1119   GFRNONAA >60 10/14/2021 1119    Lab Results  Component Value Date   WBC 5.3 10/14/2021   NEUTROABS  2.1 10/14/2021   HGB 13.1 10/14/2021   HCT 39.0 10/14/2021   MCV 83.3 10/14/2021   PLT 304 10/14/2021   No new or residual calcifications or change at site of previously biopsied coarse heterogeneous calcifications which were intermediate grade DCIS. Continued surveillance per COMET trial protocol is recommended. Annual screening of the contralateral right breast will be due in October 2023. A follow-up mammogram in 6 months is recommended  STUDIES: No results found.   ELIGIBLE FOR AVAILABLE RESEARCH PROTOCOL: COMET  ASSESSMENT: 67 y.o. [place] woman status post left breast biopsy 02/26/2021 showing ductal carcinoma in situ, grade 2, estrogen and progesterone receptor positive  (1) enrolled in COMET trial, observation arm  (2) anastrozole started 05/13/2021  (A) bone density 05/10/2019 at [facility] showed a T score of -0.5 Discontinued after 1 month    PLAN:  Sherry enrolled in the COMET study and is in the observation arm and Tamoxifen. She started Tamoxifen about May 2023. She is tolerating it really well, no adverse effects except for mild hot flashes She doesn't think arthralgias are unrelated, she had them prior. No concerns on Physical exam.  Most recent mammogram done on 529 with stable appearance of the left breast, follow-up recommended in 6 months per, trial  surveillance protocol.  She is already scheduled for her next mammogram. She will return to clinic per protocol.    Total encounter time 20 minutes.*   *Total Encounter Time as defined by the Centers for Medicare and Medicaid Services includes, in addition to the face-to-face time of a patient visit (documented in the note above) non-face-to-face time: obtaining and reviewing outside history, ordering and reviewing medications, tests or procedures, care coordination (communications with other health care professionals or caregivers) and documentation in the medical record.

## 2022-10-28 ENCOUNTER — Other Ambulatory Visit: Payer: Self-pay | Admitting: *Deleted

## 2022-10-28 ENCOUNTER — Telehealth: Payer: Self-pay | Admitting: Hematology and Oncology

## 2022-10-28 ENCOUNTER — Encounter: Payer: Self-pay | Admitting: Hematology and Oncology

## 2022-10-28 DIAGNOSIS — D0512 Intraductal carcinoma in situ of left breast: Secondary | ICD-10-CM

## 2022-10-28 NOTE — Telephone Encounter (Signed)
Spoke with patient confirming upcoming appointment  

## 2022-10-30 ENCOUNTER — Telehealth: Payer: Self-pay | Admitting: Hematology and Oncology

## 2022-10-30 NOTE — Telephone Encounter (Signed)
Spoke with patient confirming upcoming appointment  

## 2022-11-03 ENCOUNTER — Other Ambulatory Visit: Payer: Self-pay | Admitting: Hematology and Oncology

## 2023-01-22 ENCOUNTER — Other Ambulatory Visit: Payer: Medicare PPO

## 2023-04-12 ENCOUNTER — Other Ambulatory Visit: Payer: Self-pay | Admitting: *Deleted

## 2023-04-12 DIAGNOSIS — D0512 Intraductal carcinoma in situ of left breast: Secondary | ICD-10-CM

## 2023-04-26 DIAGNOSIS — R921 Mammographic calcification found on diagnostic imaging of breast: Secondary | ICD-10-CM | POA: Diagnosis not present

## 2023-04-26 DIAGNOSIS — R92322 Mammographic fibroglandular density, left breast: Secondary | ICD-10-CM | POA: Diagnosis not present

## 2023-04-28 DIAGNOSIS — H43393 Other vitreous opacities, bilateral: Secondary | ICD-10-CM | POA: Diagnosis not present

## 2023-04-29 ENCOUNTER — Encounter: Payer: Self-pay | Admitting: *Deleted

## 2023-04-29 ENCOUNTER — Inpatient Hospital Stay: Payer: Medicare PPO | Attending: Hematology and Oncology | Admitting: Hematology and Oncology

## 2023-04-29 ENCOUNTER — Inpatient Hospital Stay: Payer: Medicare PPO

## 2023-04-29 VITALS — BP 133/64 | HR 83 | Temp 97.6°F | Resp 15 | Wt 220.5 lb

## 2023-04-29 DIAGNOSIS — D0512 Intraductal carcinoma in situ of left breast: Secondary | ICD-10-CM

## 2023-04-29 DIAGNOSIS — Z79810 Long term (current) use of selective estrogen receptor modulators (SERMs): Secondary | ICD-10-CM | POA: Diagnosis not present

## 2023-04-29 DIAGNOSIS — Z006 Encounter for examination for normal comparison and control in clinical research program: Secondary | ICD-10-CM | POA: Diagnosis not present

## 2023-04-29 DIAGNOSIS — Z170 Estrogen receptor positive status [ER+]: Secondary | ICD-10-CM | POA: Insufficient documentation

## 2023-04-29 LAB — RESEARCH LABS

## 2023-04-29 NOTE — Progress Notes (Signed)
[facility]  Telephone:(336) [phone] Fax:(336) [phone]     ID: [name] DOB: May 21, 1956  MR#: [id number]  JYN#:[id number]  Patient Care Team: Georgann Housekeeper, MD as PCP - General (Internal Medicine) Donnelly Angelica, RN as Oncology Nurse Navigator Pershing Proud, RN as Oncology Nurse Navigator Harriette Bouillon, MD as Consulting Physician (General Surgery) Dorothy Puffer, MD as Consulting Physician (Radiation Oncology) Rachel Moulds, MD as Medical Oncologist (Hematology and Oncology) Rachel Moulds, MD  CHIEF COMPLAINT: noninvasive breast cancer, estrogen receptor positive  CURRENT TREATMENT: observation on COMET trial  Discussed the use of AI scribe software for clinical note transcription with the patient, who gave verbal consent to proceed.  History of Present Illness    The patient, with a history of low-grade DCIS, is currently participating in COMET study and is on tamoxifen. She reports no issues with the medication. She recently had a mammogram and was told everything was fine. She is due for another mammogram in June. She reports mild hot flashes and some joint pain, which she attributes to arthritis. She is not currently exercising regularly.  Rest of the pertinent 10 point ROS reviewed and neg.   COVID 19 VACCINATION STATUS: Moderna x2   HISTORY OF CURRENT ILLNESS: From the original intake note:  Ellen Davenport was under short-term follow up for probably-benign left breast calcifications. She underwent left diagnostic mammography with tomography at [facility] on 02/05/2021 showing: breast density category B; suspicious 0.5 cm grouped calcifications in upper-outer left breast.  Accordingly on 02/26/2021 she proceeded to biopsy of the left breast area in question. The pathology from this procedure ([id number]) showed: ductal carcinoma in situ with calcifications, intermediate grade. Prognostic indicators significant for: estrogen receptor, 100% positive and  progesterone receptor, 100% positive, both with strong staining intensity.    Cancer Staging  Ductal carcinoma in situ (DCIS) of left breast Staging form: Breast, AJCC 8th Edition - Clinical stage from 03/12/2021: Stage 0 (cTis (DCIS), cN0, cM0, ER+, PR+) - Signed by Lowella Dell, MD on 03/12/2021 Stage prefix: Initial diagnosis Nuclear grade: G2   The patient's subsequent history is as detailed below.   PAST MEDICAL HISTORY: Past Medical History:  Diagnosis Date   Breast cancer (HCC)     PAST SURGICAL HISTORY: Past Surgical History:  Procedure Laterality Date   APPENDECTOMY     ganglion cyst removal Left     FAMILY HISTORY: Family History  Problem Relation Age of Onset   Dementia Mother    Heart attack Father    Cancer Neg Hx   Her father died at age 64 from MI. Her mother died at age 54 from Alzheimer's. Ellen Davenport has 3 brothers and 3 sisters. There is no family history of cancer to her knowledge.   GYNECOLOGIC HISTORY:  No LMP recorded. Menarche: 67 years old Age at first live birth: 67 years old GX P 2 LMP age 25 Contraceptive: yes HRT never used  Hysterectomy? no BSO? 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 She lives at home with her son Ellen Davenport, age 5, and his son Ellen Davenport, who is 11 years old and works for [employer]. 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 Son Ellen Davenport [place] age 91, works as a Retail banker in [place]. Ellen Davenport is Madonna's only grandchild. Ranesha attends an [employer] in [place].    ADVANCED DIRECTIVES: son Ellen Davenport is her HCPOA.   HEALTH MAINTENANCE: Social History  Tobacco Use   Smoking status: Never   Smokeless tobacco: Never  Substance Use Topics   Alcohol use: Yes    Alcohol/week: 1.0 standard drink of alcohol    Types: 1 Glasses of wine per week   Drug use: Never     Colonoscopy: 2022 at [place]  PAP: 2022  Bone density: date  unknown   Not on File  Current Outpatient Medications  Medication Sig Dispense Refill   Ascorbic Acid (VITAMIN C) 500 MG CAPS Take 1 tablet by mouth daily.     azelastine (ASTELIN) 0.1 % nasal spray Place 1 spray into both nostrils daily as needed for allergies.     cholecalciferol (VITAMIN D3) 25 MCG (1000 UNIT) tablet Take 1,000 Units by mouth daily.     fluticasone (FLONASE) 50 MCG/ACT nasal spray Place 1 spray into both nostrils daily as needed.     Multiple Vitamin (MULTIVITAMIN) capsule Take 1 tablet by mouth daily.     olopatadine (PATANOL) 0.1 % ophthalmic solution Place 2 drops into both eyes daily as needed.     Omega 3-6-9 Fatty Acids (OMEGA 3-6-9 COMPLEX PO) Take 1 tablet by mouth daily.     Omega-3 Fatty Acids (FISH OIL) 1000 MG CAPS Take 1 tablet by mouth daily.     tamoxifen (NOLVADEX) 20 MG tablet TAKE 1 TABLET BY MOUTH EVERY DAY 90 tablet 1   Turmeric 500 MG CAPS Take 1 tablet by mouth daily.     No current facility-administered medications for this visit.    OBJECTIVE: African-American woman who appears well  Vitals:   04/29/23 0947  BP: 133/64  Pulse: 83  Resp: 15  Temp: 97.6 F (36.4 C)  SpO2: 97%     Body mass index is 33.53 kg/m.   Wt Readings from Last 3 Encounters:  04/29/23 220 lb 8 oz (100 kg)  10/27/22 217 lb 9.6 oz (98.7 kg)  04/28/22 220 lb 9.6 oz (100.1 kg)     ECOG FS:0 - Asymptomatic  Physical Exam Constitutional:      Appearance: Normal appearance.  Cardiovascular:     Rate and Rhythm: Normal rate and regular rhythm.  Chest:     Comments: Bilateral breasts inspected.  No palpable masses or regional adenopathy. Musculoskeletal:     Cervical back: Normal range of motion and neck supple. No rigidity.  Lymphadenopathy:     Cervical: No cervical adenopathy.  Neurological:     Mental Status: She is alert.      LAB RESULTS:  CMP     Component Value Date/Time   NA 140 10/14/2021 1119   K 3.8 10/14/2021 1119   CL 106 10/14/2021  1119   CO2 28 10/14/2021 1119   GLUCOSE 97 10/14/2021 1119   BUN 11 10/14/2021 1119   CREATININE 0.74 10/14/2021 1119   CALCIUM 8.9 10/14/2021 1119   PROT 7.6 10/14/2021 1119   ALBUMIN 4.0 10/14/2021 1119   AST 13 (L) 10/14/2021 1119   ALT 7 10/14/2021 1119   ALKPHOS 87 10/14/2021 1119   BILITOT 0.5 10/14/2021 1119   GFRNONAA >60 10/14/2021 1119    Lab Results  Component Value Date   WBC 5.3 10/14/2021   NEUTROABS 2.1 10/14/2021   HGB 13.1 10/14/2021   HCT 39.0 10/14/2021   MCV 83.3 10/14/2021   PLT 304 10/14/2021   04/26/2023 IMPRESSION: KNOWN-BIOPSY-PROVEN MALIGNANCY No mammograhic evidence of malignancy in the right breast. Stable appearance of the left breast biopsy site of intermediate grade DCIS (2 years ago)  without residual calcifications undergoing medical management per COMET trial. A follow-up mammogram in 6-12 months is recommended per COMET trial surveillance protocol.  This exam was interpreted at the [facility] location.  STUDIES: No results found.   ELIGIBLE FOR AVAILABLE RESEARCH PROTOCOL: COMET  ASSESSMENT: 67 y.o. [place] woman status post left breast biopsy 02/26/2021 showing ductal carcinoma in situ, grade 2, estrogen and progesterone receptor positive  (1) enrolled in COMET trial, observation arm  (2) anastrozole started 05/13/2021  (A) bone density 05/10/2019 at [place] showed a T score of -0.5 Discontinued after 1 month   PLAN:  Low Grade Ductal Carcinoma In Situ (DCIS) Patient is part of COMET study evaluating the omission of surgery in low grade DCIS. Currently on Tamoxifen with no reported side effects since May 2023. Recent mammogram reported as normal. -Continue Tamoxifen as per study protocol. -Next mammogram schedule for June 2025.  General Health Maintenance Mild hot flashes and joint pains reported. No exercise routine due to return to work. Previous bone density reported as strong. -Encourage 30 minutes of walking, 5 days a  week. -Attempt to obtain previous bone density results from Dr. Prentiss Bells office. -Schedule follow-up visit in 6 months.   Total encounter time 20 minutes.*   *Total Encounter Time as defined by the Centers for Medicare and Medicaid Services includes, in addition to the face-to-face time of a patient visit (documented in the note above) non-face-to-face time: obtaining and reviewing outside history, ordering and reviewing medications, tests or procedures, care coordination (communications with other health care professionals or caregivers) and documentation in the medical record.

## 2023-04-29 NOTE — Research (Signed)
AFT - 25: COMPARING AN OPERATION TO MONITORING, WITH OR WITHOUT ENDOCRINE THERAPY (COMET) FOR LOW RISK DCIS: A PHASE III PROSPECTIVE RANDOMIZED TRIAL   The pt was into the cancer center this morning unaccompanied for her month 24 study visit on the COMET study.  The pt reports she has been doing really well. She states that she recently went back to work.  The pt denies any new health issues and any COVID-19 infections since her last study visit.     Questionnaires: The month 24 questionnaires were emailed to the pt on 04/24/23.  The pt was told that she has until 06/19/23 to complete her online questionnaires.  The pt said that she would look for the study email today. The pt was encouraged to contact the nurse if she needs the questionnaire study link emailed to her again.  The pt verbalized understanding.     LABS: The pt's month 24 research labs were obtained today.  The pt's blood samples will be shipped today by Adella Nissen, research specialist.   Imaging:  The pt's mammogram was done at [facility] on 04/26/23. Mammogram impression stated "No change in appearance of site of biopsy proven intermediate grade DCIS in the 3 o'clock location left breast, middle depth.  Scattered benign calcifications throughout the breast appear stable. No significant masses, distortion, or other findings in either breast."  Next imaging to be done in 6 months per protocol.    MEDICATION REVIEW:  The pt said that she takes her tamoxifen "almost daily".  She said that she is tolerating her tamoxifen well.     MD/PROVIDER VISIT: Patient was seen and examined by Dr. Al Pimple today.  Please see MD notes.     ADVERSE EVENTS: [name] reports the following Solicited AE's as ongoing: mild arthralgia (grade 1) and hot flashes (grade 1).  She denied all of the other Solicited AE's. She denies any cardiac and vascular problems.  She denies any recent cholesterol or bone density tests.     ADVERSE EVENT LOGDeah Davenport [id number] 04/29/2023 Study/Protocol: COMET/ AFT-25 24 month Solicited AE's  Dr. Al Pimple provided attributions to the following Solicited AE's on 04/29/23.   Event Grade Onset Date Resolved Date Drug Name Attribution Treatment Comments  Arthralgia Grade 1 06/16/21  Ongoing  Tamoxifen Unrelated   Pt reports AE as "mild"  Hot flashes  Grade 1 unknown Ongoing  Tamoxifen  Probable None   "mild"  Not evaluated Solicited AE's include the following: osteoporosis and cholesterol.       The patient was thanked for their time and continued voluntary participation in this study. The pt is aware that her next study visit will be due in June 2025 for her month 30 visit.  Dr. Al Pimple ordered the pt's next unilateral mammogram for June 2025. [name] has been provided direct contact information and is encouraged to contact this Nurse for any needs or questions.  Janan Ridge RN, BSN, CCRP Clinical Research Nurse Lead 04/29/2023 10:47 AM

## 2023-05-11 ENCOUNTER — Encounter: Payer: Self-pay | Admitting: Hematology and Oncology

## 2023-06-01 DIAGNOSIS — Z1331 Encounter for screening for depression: Secondary | ICD-10-CM | POA: Diagnosis not present

## 2023-06-01 DIAGNOSIS — Z01419 Encounter for gynecological examination (general) (routine) without abnormal findings: Secondary | ICD-10-CM | POA: Diagnosis not present

## 2023-06-01 DIAGNOSIS — Z124 Encounter for screening for malignant neoplasm of cervix: Secondary | ICD-10-CM | POA: Diagnosis not present

## 2023-06-01 DIAGNOSIS — Z01411 Encounter for gynecological examination (general) (routine) with abnormal findings: Secondary | ICD-10-CM | POA: Diagnosis not present

## 2023-06-02 ENCOUNTER — Other Ambulatory Visit: Payer: Self-pay | Admitting: Hematology and Oncology

## 2023-07-30 DIAGNOSIS — E785 Hyperlipidemia, unspecified: Secondary | ICD-10-CM | POA: Diagnosis not present

## 2023-07-30 DIAGNOSIS — Z0000 Encounter for general adult medical examination without abnormal findings: Secondary | ICD-10-CM | POA: Diagnosis not present

## 2023-07-30 DIAGNOSIS — M179 Osteoarthritis of knee, unspecified: Secondary | ICD-10-CM | POA: Diagnosis not present

## 2023-07-30 DIAGNOSIS — M519 Unspecified thoracic, thoracolumbar and lumbosacral intervertebral disc disorder: Secondary | ICD-10-CM | POA: Diagnosis not present

## 2023-07-30 DIAGNOSIS — D0510 Intraductal carcinoma in situ of unspecified breast: Secondary | ICD-10-CM | POA: Diagnosis not present

## 2023-07-30 DIAGNOSIS — Z1389 Encounter for screening for other disorder: Secondary | ICD-10-CM | POA: Diagnosis not present

## 2023-07-30 DIAGNOSIS — E559 Vitamin D deficiency, unspecified: Secondary | ICD-10-CM | POA: Diagnosis not present

## 2023-07-30 DIAGNOSIS — R7303 Prediabetes: Secondary | ICD-10-CM | POA: Diagnosis not present

## 2023-07-30 DIAGNOSIS — J301 Allergic rhinitis due to pollen: Secondary | ICD-10-CM | POA: Diagnosis not present

## 2023-09-07 IMAGING — DX DG KNEE 3 VIEWS*R*
3 series · 3 of 3 positions shown · non-contrast
Comparison: None.

CLINICAL DATA: Acute right knee pain.

EXAM:
RIGHT KNEE - 3 VIEW

[dg knee complete 4 views right (1 of 3)]
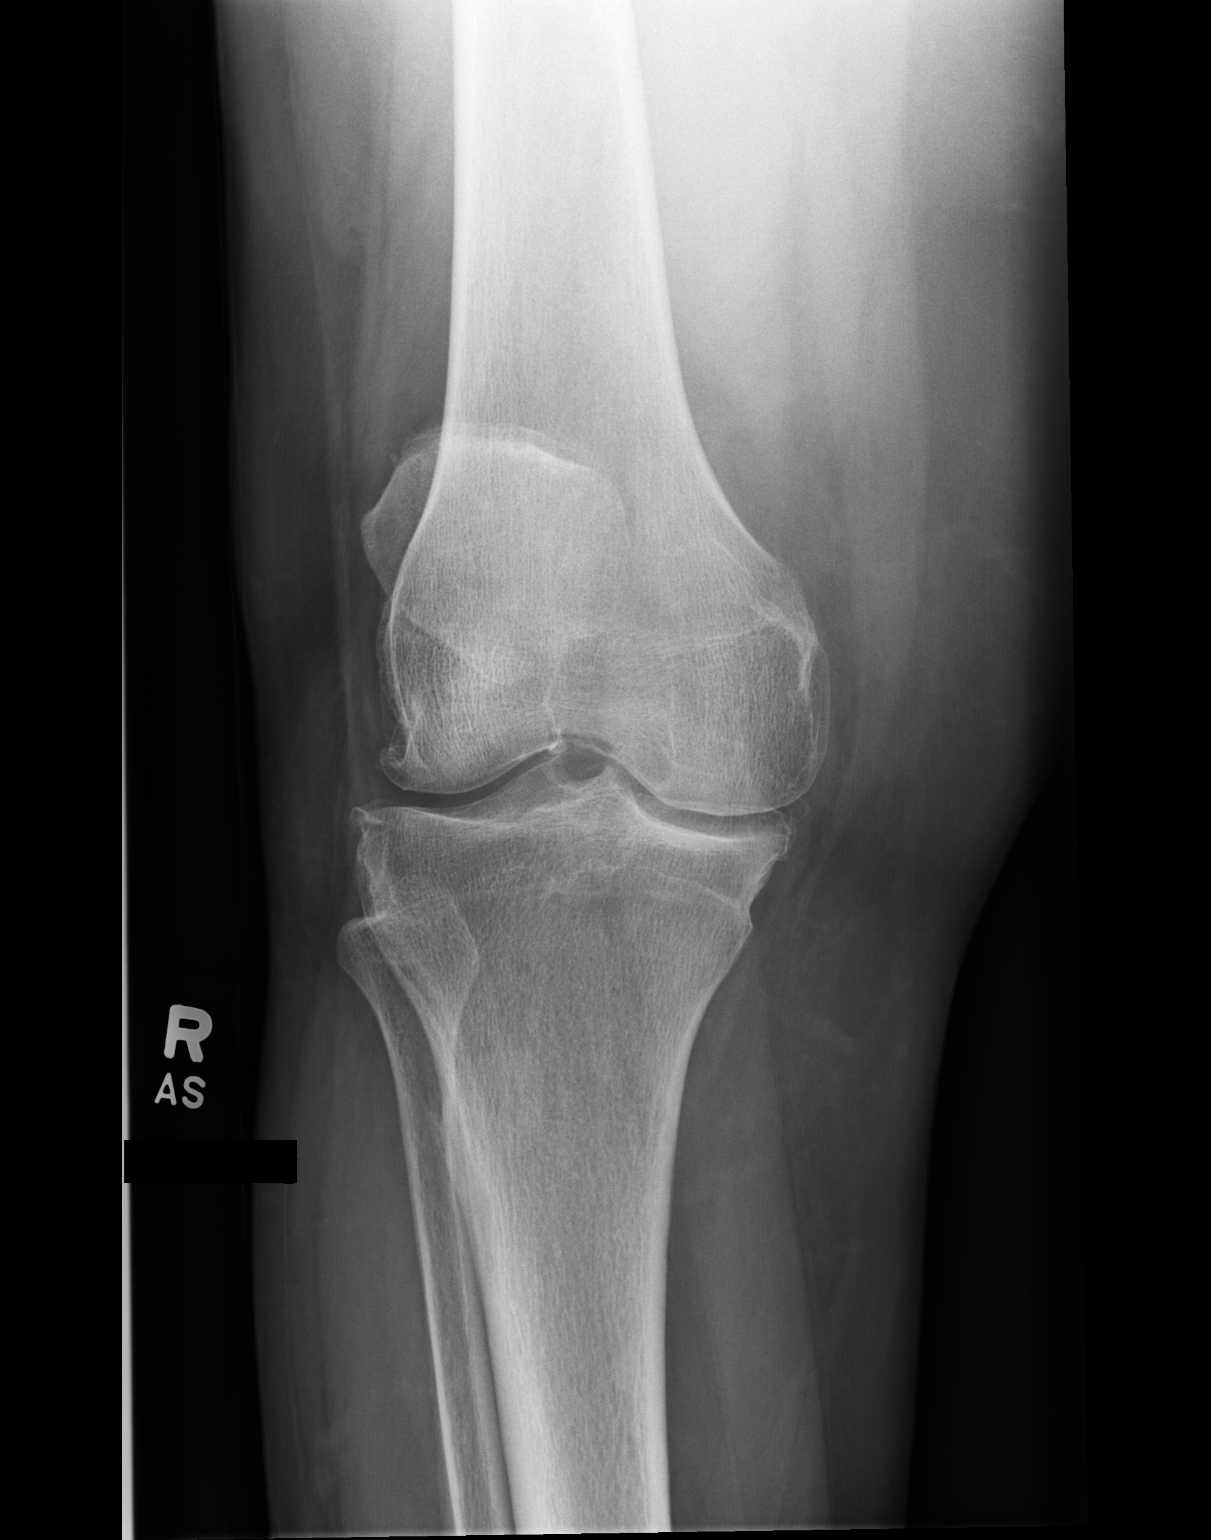

[dg knee complete 4 views right (2 of 3)]
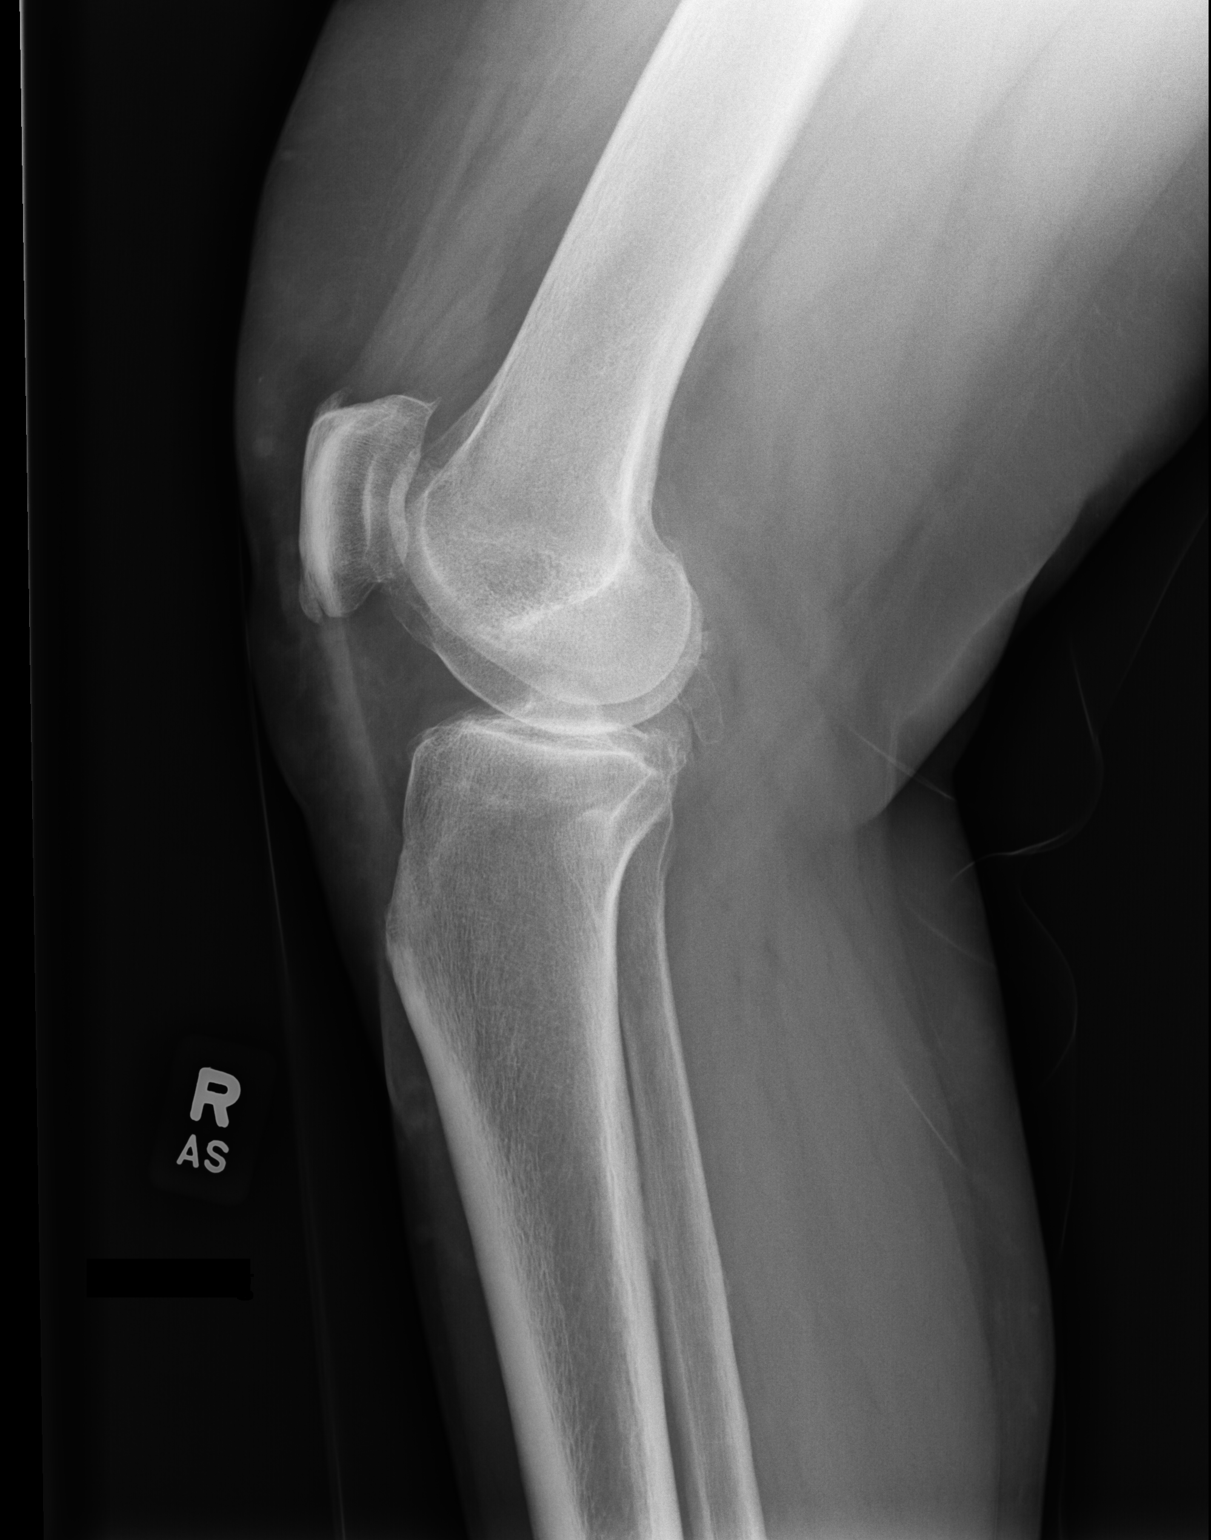

[dg knee complete 4 views right (3 of 3)]
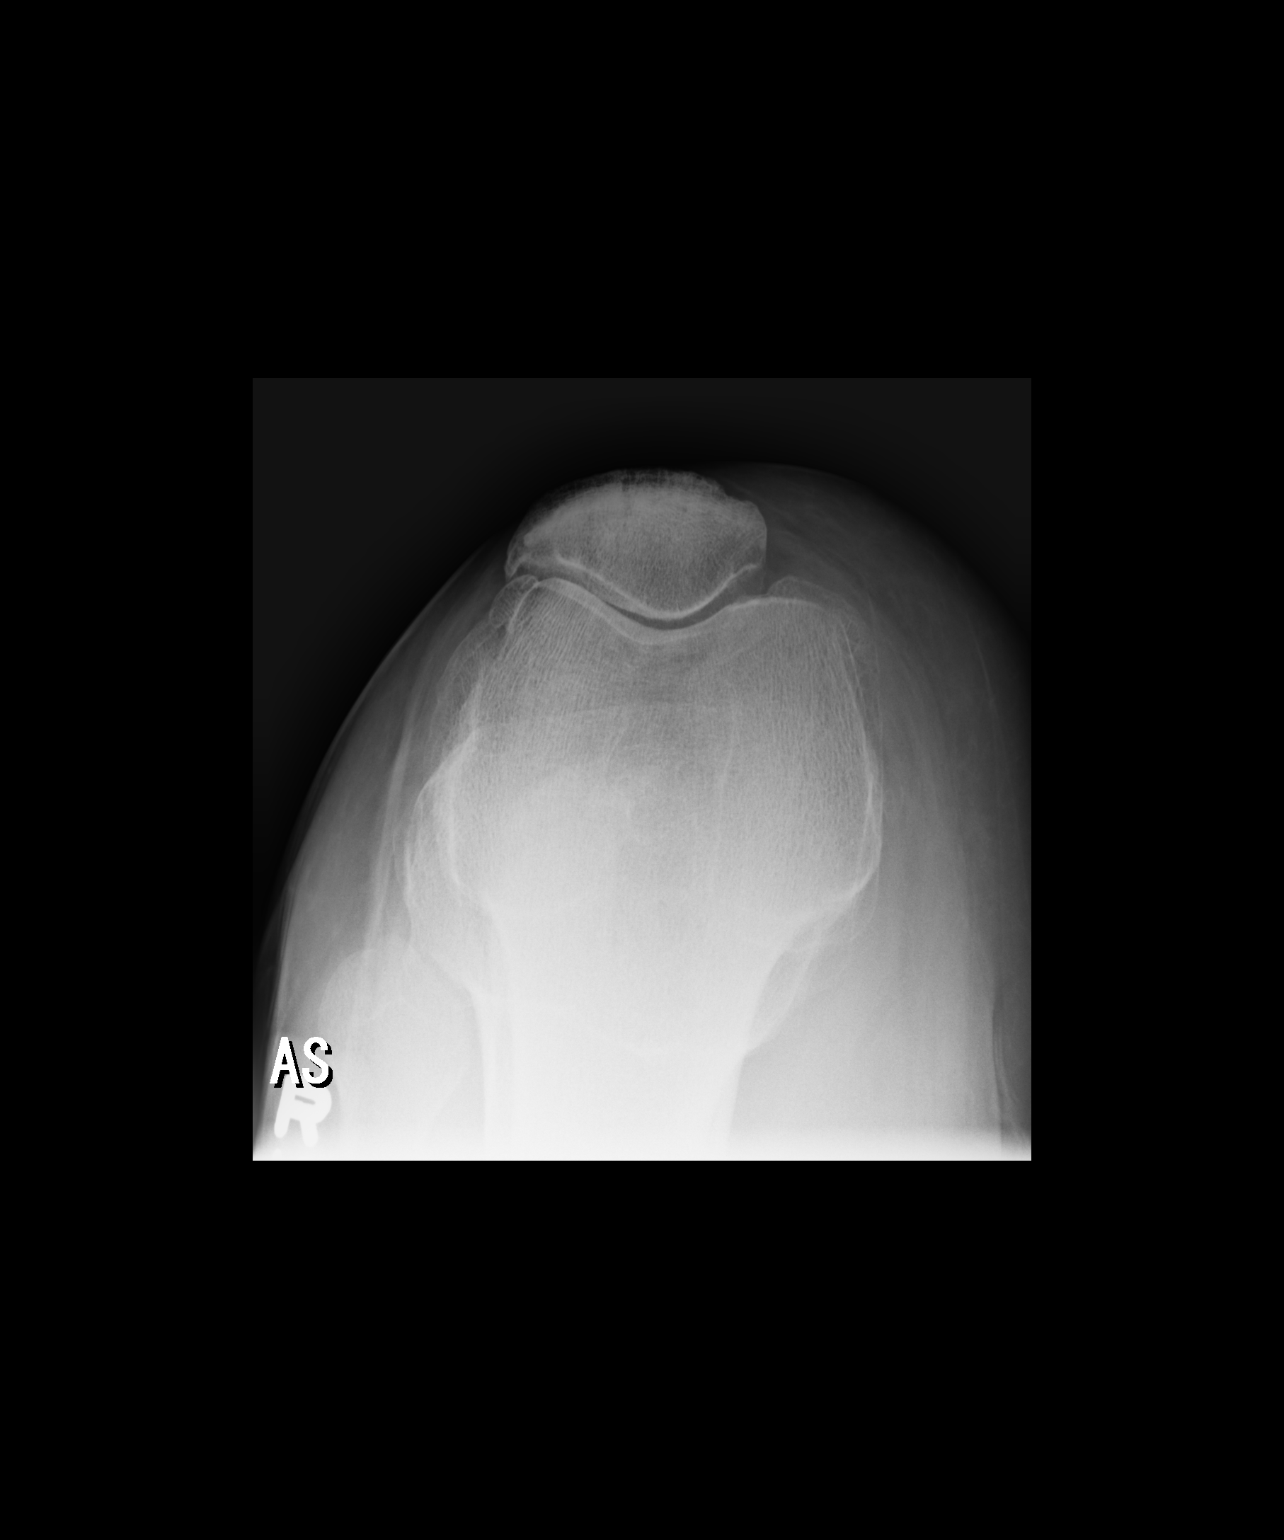

[3 of 3 positions shown; findings below may reference images not displayed]

FINDINGS: No evidence of an acute fracture or dislocation. Moderate to marked
severity medial and lateral tibiofemoral compartment space narrowing
is seen. Similar severity patellofemoral narrowing is also noted. A
small joint effusion is noted.
IMPRESSION: 1. Moderate to marked severity tricompartmental degenerative
changes.
2. Small joint effusion.

## 2023-10-26 DIAGNOSIS — Z08 Encounter for follow-up examination after completed treatment for malignant neoplasm: Secondary | ICD-10-CM | POA: Diagnosis not present

## 2023-10-26 DIAGNOSIS — R92323 Mammographic fibroglandular density, bilateral breasts: Secondary | ICD-10-CM | POA: Diagnosis not present

## 2023-10-26 DIAGNOSIS — Z853 Personal history of malignant neoplasm of breast: Secondary | ICD-10-CM | POA: Diagnosis not present

## 2023-10-27 ENCOUNTER — Telehealth: Payer: Self-pay

## 2023-10-27 NOTE — Telephone Encounter (Signed)
 Left message confirming appt for 6/5

## 2023-10-28 ENCOUNTER — Encounter: Payer: Self-pay | Admitting: Hematology and Oncology

## 2023-10-28 ENCOUNTER — Inpatient Hospital Stay: Payer: Medicare PPO | Attending: Hematology and Oncology | Admitting: Hematology and Oncology

## 2023-10-28 ENCOUNTER — Encounter: Payer: Self-pay | Admitting: *Deleted

## 2023-10-28 VITALS — BP 162/88 | HR 88 | Temp 98.2°F | Resp 18 | Ht 68.0 in | Wt 227.2 lb

## 2023-10-28 DIAGNOSIS — D0512 Intraductal carcinoma in situ of left breast: Secondary | ICD-10-CM

## 2023-10-28 DIAGNOSIS — Z170 Estrogen receptor positive status [ER+]: Secondary | ICD-10-CM | POA: Insufficient documentation

## 2023-10-28 DIAGNOSIS — Z79810 Long term (current) use of selective estrogen receptor modulators (SERMs): Secondary | ICD-10-CM | POA: Insufficient documentation

## 2023-10-28 DIAGNOSIS — Z79811 Long term (current) use of aromatase inhibitors: Secondary | ICD-10-CM | POA: Insufficient documentation

## 2023-10-28 DIAGNOSIS — Z006 Encounter for examination for normal comparison and control in clinical research program: Secondary | ICD-10-CM | POA: Diagnosis present

## 2023-10-28 DIAGNOSIS — Z1721 Progesterone receptor positive status: Secondary | ICD-10-CM | POA: Insufficient documentation

## 2023-10-28 MED ORDER — TAMOXIFEN CITRATE 20 MG PO TABS: 20.0000 mg | ORAL_TABLET | Freq: Every day | ORAL | 1 refills | Status: AC

## 2023-10-28 NOTE — Progress Notes (Signed)
 [facility]  Telephone:(336) [phone] Fax:(336) [phone]     ID: Ellen [facility] DOB: 1956/03/20  MR#: [id number]  VOZ#:[id number]  Patient Care Team: Jearldine Mina, MD as PCP - General (Internal Medicine) Alane Hsu, RN as Oncology Nurse Navigator Auther Bo, RN as Oncology Nurse Navigator Sim Dryer, MD as Consulting Physician (General Surgery) Johna Myers, MD as Consulting Physician (Radiation Oncology) Murleen Arms, MD as Medical Oncologist (Hematology and Oncology) Murleen Arms, MD  CHIEF COMPLAINT: noninvasive breast cancer, estrogen receptor positive  CURRENT TREATMENT: observation on COMET trial  Discussed the use of AI scribe software for clinical note transcription with the patient, who gave verbal consent to proceed.  History of Present Illness    The patient, with a history of low-grade DCIS, is currently participating in COMET study and is on tamoxifen . She reports no issues with the medication. She recently had a mammogram and was told everything was fine.    Rest of the pertinent 10 point ROS reviewed and neg.   COVID 19 VACCINATION STATUS: Moderna x2   HISTORY OF CURRENT ILLNESS: From the original intake note:  Ellen [facility]en[facility] was under short-term follow up for probably-benign left breast calcifications. She underwent left diagnostic mammography with tomography at [facility] on 02/05/2021 showing: breast density category B; suspicious 0.5 cm grouped calcifications in upper-outer left breast.  Accordingly on 02/26/2021 she proceeded to biopsy of the left breast area in question. The pathology from this procedure ([id number]) showed: ductal carcinoma in situ with calcifications, intermediate grade. Prognostic indicators significant for: estrogen receptor, 100% positive and progesterone receptor, 100% positive, both with strong staining intensity.    Cancer Staging  Ductal carcinoma in situ (DCIS) of left breast Staging form: Breast, AJCC  8th Edition - Clinical stage from 03/12/2021: Stage 0 (cTis (DCIS), cN0, cM0, ER+, PR+) - Signed by Willy Harvest, MD on 03/12/2021 Stage prefix: Initial diagnosis Nuclear grade: G2  Discussed the use of AI scribe software for clinical note transcription with the patient, who gave verbal consent to proceed.  History of Present Illness  Ellen [place] is a 68 year old female with breast cancer who presents for follow-up regarding her tamoxifen  treatment and recent mammogram results.  She is currently on tamoxifen  therapy for breast cancer and has no issues with the medication. She experiences mild hot flashes nightly and some joint pain but no significant changes in symptoms. No bleeding or need for hospitalization.  She recently underwent a mammogram, stable, no changes.  She is part of a comet study and is due for a follow-up mammogram in six to twelve months.  Her exercise routine includes walking for thirty minutes a day, although she notes some weight gain despite this activity. She had blood work done in December as part of the research study she is involved in, and she typically has blood work once a year for this purpose.  No new medications, hospital visits, or bleeding from anywhere.   PAST MEDICAL HISTORY: Past Medical History:  Diagnosis Date   Breast cancer (HCC)     PAST SURGICAL HISTORY: Past Surgical History:  Procedure Laterality Date   APPENDECTOMY     ganglion cyst removal Left     FAMILY HISTORY: Family History  Problem Relation Age of Onset   Dementia Mother    Heart attack Father    Cancer Neg Hx   Her father died at age 43 from MI. Her mother died at age 52 from Alzheimer's. Guiliana has 3 brothers and  3 sisters. There is no family history of cancer to her knowledge.   GYNECOLOGIC HISTORY:  No LMP recorded. Menarche: 68 years old Age at first live birth: 68 years old GX P 2 LMP age 68 Contraceptive: yes HRT never used  Hysterectomy? no BSO?  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 She lives at home with her son Bambi Lever, age 81, and his son Vickie Grana, who is 85 years old and works for [employer]. Bambi Lever works as a Art therapist. Son Kingsley Penny [place]"), age 2, works as a Retail banker in [place]. Vickie Grana is Offie's only grandchild. Letrice attends an [employer] in [place] [place].    ADVANCED DIRECTIVES: son Kingsley Penny is her HCPOA.   HEALTH MAINTENANCE: Social History   Tobacco Use   Smoking status: Never   Smokeless tobacco: Never  Substance Use Topics   Alcohol use: Yes    Alcohol/week: 1.0 standard drink of alcohol    Types: 1 Glasses of wine per week   Drug use: Never     Colonoscopy: 2022 at [place] [facility]  PAP: 2022  Bone density: date unknown   Not on File  Current Outpatient Medications  Medication Sig Dispense Refill   Ascorbic Acid (VITAMIN C) 500 MG CAPS Take 1 tablet by mouth daily.     azelastine (ASTELIN) 0.1 % nasal spray Place 1 spray into both nostrils daily as needed for allergies.     cholecalciferol (VITAMIN D3) 25 MCG (1000 UNIT) tablet Take 1,000 Units by mouth daily.     fluticasone (FLONASE) 50 MCG/ACT nasal spray Place 1 spray into both nostrils daily as needed.     Multiple Vitamin (MULTIVITAMIN) capsule Take 1 tablet by mouth daily.     olopatadine (PATANOL) 0.1 % ophthalmic solution Place 2 drops into both eyes daily as needed.     Omega 3-6-9 Fatty Acids (OMEGA 3-6-9 COMPLEX PO) Take 1 tablet by mouth daily.     Omega-3 Fatty Acids (FISH OIL) 1000 MG CAPS Take 1 tablet by mouth daily.     tamoxifen  (NOLVADEX ) 20 MG tablet TAKE 1 TABLET BY MOUTH EVERY DAY 90 tablet 1   Turmeric 500 MG CAPS Take 1 tablet by mouth daily.     No current facility-administered medications for this visit.    OBJECTIVE: African-American woman who appears well  Vitals:   10/28/23 1007  BP: (!) 162/88  Pulse: 88  Resp: 18  Temp: 98.2  F (36.8 C)  SpO2: 99%     Body mass index is 34.55 kg/m.   Wt Readings from Last 3 Encounters:  10/28/23 227 lb 3.2 oz (103.1 kg)  04/29/23 220 lb 8 oz (100 kg)  10/27/22 217 lb 9.6 oz (98.7 kg)     ECOG FS:0 - Asymptomatic  Physical Exam Constitutional:      Appearance: Normal appearance.  Cardiovascular:     Rate and Rhythm: Normal rate and regular rhythm.  Chest:     Comments: Bilateral breasts inspected.  No palpable masses or regional adenopathy. Musculoskeletal:     Cervical back: Normal range of motion and neck supple. No rigidity.  Lymphadenopathy:     Cervical: No cervical adenopathy.  Neurological:     Mental Status: She is alert.      LAB RESULTS:  CMP     Component Value Date/Time   NA 140 10/14/2021 1119   K 3.8 10/14/2021 1119   CL 106 10/14/2021  1119   CO2 28 10/14/2021 1119   GLUCOSE 97 10/14/2021 1119   BUN 11 10/14/2021 1119   CREATININE 0.74 10/14/2021 1119   CALCIUM 8.9 10/14/2021 1119   PROT 7.6 10/14/2021 1119   ALBUMIN 4.0 10/14/2021 1119   AST 13 (L) 10/14/2021 1119   ALT 7 10/14/2021 1119   ALKPHOS 87 10/14/2021 1119   BILITOT 0.5 10/14/2021 1119   GFRNONAA >60 10/14/2021 1119    Lab Results  Component Value Date   WBC 5.3 10/14/2021   NEUTROABS 2.1 10/14/2021   HGB 13.1 10/14/2021   HCT 39.0 10/14/2021   MCV 83.3 10/14/2021   PLT 304 10/14/2021   04/26/2023 IMPRESSION: KNOWN-BIOPSY-PROVEN MALIGNANCY No mammograhic evidence of malignancy in the right breast. Stable appearance of the left breast biopsy site of intermediate grade DCIS (2 years ago) without residual calcifications undergoing medical management per COMET trial. A follow-up mammogram in 6-12 months is recommended per COMET trial surveillance protocol.  This exam was interpreted at the [facility] location.  STUDIES: No results found.   ELIGIBLE FOR AVAILABLE RESEARCH PROTOCOL: COMET  ASSESSMENT: 68 y.o. [place] woman status post left breast biopsy  02/26/2021 showing ductal carcinoma in situ, grade 2, estrogen and progesterone receptor positive  (1) enrolled in COMET trial, observation arm  (2) anastrozole  started 05/13/2021  (A) bone density 05/10/2019 at [facility] showed a T score of -0.5 Discontinued after 1 month   PLAN:  Low Grade Ductal Carcinoma In Situ (DCIS) Patient is part of COMET study evaluating the omission of surgery in low grade DCIS. Currently on Tamoxifen  with no reported side effects since May 2023 except for minor hot flashes. Recent mammogram reported as stable. -Continue Tamoxifen  as per study protocol. -Next mammogram schedule for Dec 2025  General Health Maintenance Mild hot flashes and joint pains reported.. -Encourage 30 minutes of walking, 5 days a week. -Schedule follow-up visit in 6 months.   Total encounter time 20 minutes.*   *Total Encounter Time as defined by the Centers for Medicare and Medicaid Services includes, in addition to the face-to-face time of a patient visit (documented in the note above) non-face-to-face time: obtaining and reviewing outside history, ordering and reviewing medications, tests or procedures, care coordination (communications with other health care professionals or caregivers) and documentation in the medical record.

## 2023-10-28 NOTE — Research (Signed)
 AFT - 25: COMPARING AN OPERATION TO MONITORING, WITH OR WITHOUT ENDOCRINE THERAPY (COMET) FOR LOW RISK DCIS: A PHASE III PROSPECTIVE RANDOMIZED TRIAL   The pt was into the cancer center this morning unaccompanied for her month 30 study visit on the COMET study.  The pt reports she has been doing really well. The pt denies any new health issues and any COVID-19 infections since her last study visit.     Questionnaires: No questionnaires are due at this visit.    LABS: No research samples are due at this visit.    Imaging:  The pt's mammogram was done at [facility] on 10/26/23. Mammogram impression stated "No change in appearance of site of biopsy proven intermediate grade DCIS in the 3 o'clock location left breast, middle depth.  Scattered benign calcifications throughout the breast appear stable. No significant masses, distortion, or other findings in either breast."  Next imaging to be done in 6 months per protocol.    MEDICATION REVIEW:  The pt said that she takes her tamoxifen  "almost daily".  She said that she is tolerating her tamoxifen  well.     MD/PROVIDER VISIT: Patient was seen and examined by Dr. Arno Bibles today.  Please see MD notes.     ADVERSE EVENTS: Patient Timber Marshman reports the following Solicited AE's as ongoing: mild arthralgia (grade 1) -mainly bilateral knees and hot flashes (grade 1).  She states her hot flashes are mild, and she usually gets 1 hot flash/day during the night only. She denied all of the other Solicited AE's. She denies any cardiac and vascular problems.  She denies any recent cholesterol or bone density tests.     ADVERSE EVENT LOG:  Kam Kerins Study/Protocol: COMET/ AFT-25 30 month Solicited AE's  Dr. Arno Bibles provided attributions to the following Solicited AE's on 10/28/23   Event Grade Onset Date Resolved Date Drug Name Attribution Treatment Comments  Arthralgia Grade 1 06/16/21  Ongoing  Tamoxifen  Unrelated   Pt reports AE as "mild"  Hot flashes  Grade  1 unknown Ongoing  Tamoxifen   Probable None   "mild"  Not evaluated Solicited AE's include the following: osteoporosis and cholesterol.       The patient was thanked for her time and continued voluntary participation in this study. The pt is aware that her next study visit will be due in December 2025 for her month 36 visit.  Dr. Arno Bibles ordered the pt's next mammogram for December 2025. Patient Reham Slabaugh has been provided direct contact information and is encouraged to contact this Nurse for any needs or questions.  Elfida Grinder RN, BSN, CCRP Clinical Research Nurse Lead 10/28/2023 10:44 AM

## 2023-10-29 ENCOUNTER — Encounter: Payer: Self-pay | Admitting: Hematology and Oncology

## 2024-02-17 ENCOUNTER — Telehealth: Payer: Self-pay | Admitting: Hematology and Oncology

## 2024-02-17 NOTE — Telephone Encounter (Signed)
 Scheduled appointment per 6/5 in basket message. Called and left a VM with the appointment details for the patient.

## 2024-04-07 ENCOUNTER — Telehealth: Payer: Self-pay | Admitting: Hematology and Oncology

## 2024-04-07 ENCOUNTER — Other Ambulatory Visit: Payer: Self-pay | Admitting: *Deleted

## 2024-04-07 DIAGNOSIS — D0512 Intraductal carcinoma in situ of left breast: Secondary | ICD-10-CM

## 2024-04-07 NOTE — Telephone Encounter (Signed)
 I spoke with patient to reschedule appointment from 05/01/2024 to 05/04/2024. Patient aware of date/time.

## 2024-04-27 DIAGNOSIS — R928 Other abnormal and inconclusive findings on diagnostic imaging of breast: Secondary | ICD-10-CM | POA: Diagnosis not present

## 2024-05-01 ENCOUNTER — Other Ambulatory Visit

## 2024-05-01 ENCOUNTER — Ambulatory Visit: Admitting: Hematology and Oncology

## 2024-05-03 ENCOUNTER — Other Ambulatory Visit: Payer: Self-pay | Admitting: Hematology and Oncology

## 2024-05-03 ENCOUNTER — Telehealth: Payer: Self-pay

## 2024-05-03 DIAGNOSIS — D0512 Intraductal carcinoma in situ of left breast: Secondary | ICD-10-CM

## 2024-05-03 NOTE — Telephone Encounter (Signed)
 Spoke with patient and confirmed appointment on 12/11

## 2024-05-03 NOTE — Progress Notes (Signed)
 Mammogram ordered for June 2026.  Ellen Davenport

## 2024-05-04 ENCOUNTER — Encounter: Payer: Self-pay | Admitting: *Deleted

## 2024-05-04 ENCOUNTER — Inpatient Hospital Stay: Attending: Hematology and Oncology

## 2024-05-04 ENCOUNTER — Inpatient Hospital Stay: Admitting: Hematology and Oncology

## 2024-05-04 ENCOUNTER — Other Ambulatory Visit: Payer: Self-pay | Admitting: *Deleted

## 2024-05-04 VITALS — BP 139/58 | HR 95 | Temp 98.1°F | Resp 17 | Wt 221.8 lb

## 2024-05-04 DIAGNOSIS — D0512 Intraductal carcinoma in situ of left breast: Secondary | ICD-10-CM

## 2024-05-04 DIAGNOSIS — Z006 Encounter for examination for normal comparison and control in clinical research program: Secondary | ICD-10-CM | POA: Insufficient documentation

## 2024-05-04 LAB — CMP (CANCER CENTER ONLY)
ALT: 10 U/L (ref 0–44)
AST: 29 U/L (ref 15–41)
Albumin: 4 g/dL (ref 3.5–5.0)
Alkaline Phosphatase: 56 U/L (ref 38–126)
Anion gap: 10 (ref 5–15)
BUN: 10 mg/dL (ref 8–23)
CO2: 26 mmol/L (ref 22–32)
Calcium: 8.8 mg/dL — ABNORMAL LOW (ref 8.9–10.3)
Chloride: 107 mmol/L (ref 98–111)
Creatinine: 0.86 mg/dL (ref 0.44–1.00)
GFR, Estimated: >60 mL/min (ref >60)
Glucose, Bld: 140 mg/dL — ABNORMAL HIGH (ref 70–99)
Potassium: 3.8 mmol/L (ref 3.5–5.1)
Sodium: 142 mmol/L (ref 135–145)
Total Bilirubin: 0.3 mg/dL (ref 0.0–1.2)
Total Protein: 7.6 g/dL (ref 6.5–8.1)

## 2024-05-04 LAB — CBC WITH DIFFERENTIAL (CANCER CENTER ONLY)
Abs Immature Granulocytes: 0.01 K/uL (ref 0.00–0.07)
Basophils Absolute: 0 K/uL (ref 0.0–0.1)
Basophils Relative: 0 %
Eosinophils Absolute: 0.1 K/uL (ref 0.0–0.5)
Eosinophils Relative: 2 %
HCT: 37.6 % (ref 36.0–46.0)
Hemoglobin: 12.5 g/dL (ref 12.0–15.0)
Immature Granulocytes: 0 %
Lymphocytes Relative: 52 %
Lymphs Abs: 2.5 K/uL (ref 0.7–4.0)
MCH: 27.7 pg (ref 26.0–34.0)
MCHC: 33.2 g/dL (ref 30.0–36.0)
MCV: 83.2 fL (ref 80.0–100.0)
Monocytes Absolute: 0.3 K/uL (ref 0.1–1.0)
Monocytes Relative: 6 %
Neutro Abs: 1.9 K/uL (ref 1.7–7.7)
Neutrophils Relative %: 40 %
Platelet Count: 258 K/uL (ref 150–400)
RBC: 4.52 MIL/uL (ref 3.87–5.11)
RDW: 12.7 % (ref 11.5–15.5)
WBC Count: 5.1 K/uL (ref 4.0–10.5)
nRBC: 0 % (ref 0.0–0.2)

## 2024-05-04 LAB — RESEARCH LABS

## 2024-05-04 NOTE — Research (Signed)
 AFT - 25: COMPARING AN OPERATION TO MONITORING, WITH OR WITHOUT ENDOCRINE THERAPY (COMET) FOR LOW RISK DCIS: A PHASE III PROSPECTIVE RANDOMIZED TRIAL   The pt was into the cancer center this morning unaccompanied for her month 36 study visit on the COMET study.  The pt reports she has been doing really well. The pt denies any new health issues and any COVID-19 infections since her last study visit. The pt said that she plans to officially retire soon due to work stress.     Questionnaires:  Questionnaires completed by the patient on 05/02/24.  The pt was thanked for her compliance of study procedures.     LABS: Research samples were obtained today.  Samples will be shipped today.     Imaging:  The pt's mammogram was done at [facility] on 04/27/24. Mammogram impression stated No change in appearance of site of biopsy proven intermediate grade DCIS in the 3 o'clock location left breast, middle depth.  Scattered benign calcifications throughout the breast appear stable. No significant masses, distortion, or other findings in the left breast.  Next bilateral imaging to be done in 6 months per protocol.    MEDICATION REVIEW:  The pt said that she takes her tamoxifen  almost daily.  She said that she is tolerating her tamoxifen  well.     MD/PROVIDER VISIT: Patient was seen and examined by Dr. Loretha today.  Please see MD notes.     ADVERSE EVENTS: Patient Ashton Sabine reports the following Solicited AE's as ongoing: mild hot flashes (grade 1).  She states her past arthralgia pain has resolved.  She denied all of the other Solicited AE's. She denies any cardiac and vascular problems.  She denies a recent bone density test.     ADVERSE EVENT LOG:  Belkis [place] Study/Protocol: COMET/ AFT-25 36 month Solicited AE's  Dr. Loretha provided attributions to the following Solicited AE's on 05/04/24   Event Grade Onset Date Resolved Date Drug Name Attribution Treatment Comments  Arthralgia Grade 1 06/16/21   05/04/2024 Tamoxifen  Unrelated   Pt reports AE as mild  Hot flashes  Grade 1 unknown Ongoing  Tamoxifen   Probable None   mild  Not evaluated Solicited AE include the following: osteoporosis   The patient was thanked for her time and continued voluntary participation in this study. The pt is aware that her next study visit will be due in June 2026 for her month 42 visit.  Dr. Loretha ordered the pt's next mammogram for June 2026. Patient Lakima Dona has been provided direct contact information and is encouraged to contact this Nurse for any needs or questions.  Levon FREDRIK Sandifer RN, BSN, CCRP Clinical Research Nurse Lead 05/04/2024 11:57 AM

## 2024-05-04 NOTE — Progress Notes (Signed)
 [facility]  Telephone:(336) [phone] Fax:(336) [phone]     ID: Ellen Davenport DOB: May 03, 1956  MR#: [id number]  RDW#:[id number]  Patient Care Team: Ellen Other, MD as PCP - General (Internal Medicine) Ellen Nanetta SAILOR, RN as Oncology Nurse Navigator Ellen Ned, MD as Consulting Physician (General Surgery) Ellen Rush, MD as Consulting Physician (Radiation Oncology) Ellen Ash, MD as Medical Oncologist (Hematology and Oncology) [place] Loretha, MD  CHIEF COMPLAINT: noninvasive breast cancer, estrogen receptor positive  CURRENT TREATMENT: observation on COMET trial  Discussed the use of AI scribe software for clinical note transcription with the patient, who gave verbal consent to proceed.  History of Present Illness    Ellen Davenport is a 68 year old female with low-grade ductal carcinoma in situ of the breast who presents for routine oncology follow-up.  She is three years from initial diagnosis of low-grade DCIS and continues on a six-month surveillance protocol.  She remains on daily tamoxifen  with only occasional missed doses and has not experienced new or worsening side effects.  She denies bone pain, urinary symptoms, bowel changes, or any new symptoms since her last visit. She is not currently exercising regularly but previously walked daily.  Rest of the pertinent 10 point ROS reviewed and neg.   COVID 19 VACCINATION STATUS: Moderna x2   HISTORY OF CURRENT ILLNESS: From the original intake note:  Ellen Davenport was under short-term follow up for probably-benign left breast calcifications. She underwent left diagnostic mammography with tomography at [facility] on 02/05/2021 showing: breast density category B; suspicious 0.5 cm grouped calcifications in upper-outer left breast.  Accordingly on 02/26/2021 she proceeded to biopsy of the left breast area in question. The pathology from this procedure ([id number]) showed: ductal carcinoma in situ with  calcifications, intermediate grade. Prognostic indicators significant for: estrogen receptor, 100% positive and progesterone receptor, 100% positive, both with strong staining intensity.    Cancer Staging  Ductal carcinoma in situ (DCIS) of left breast Staging form: Breast, AJCC 8th Edition - Clinical stage from 03/12/2021: Stage 0 (cTis (DCIS), cN0, cM0, ER+, PR+) - Signed by Ellen Sandria BROCKS, MD on 03/12/2021 Stage prefix: Initial diagnosis Nuclear grade: G2    PAST MEDICAL HISTORY: Past Medical History:  Diagnosis Date   Breast cancer (HCC)     PAST SURGICAL HISTORY: Past Surgical History:  Procedure Laterality Date   APPENDECTOMY     ganglion cyst removal Left     FAMILY HISTORY: Family History  Problem Relation Age of Onset   Dementia Mother    Heart attack Father    Cancer Neg Hx   Her father died at age 77 from MI. Her mother died at age 56 from Alzheimer's. Ellen Davenport has 3 brothers and 3 sisters. There is no family history of cancer to her knowledge.   GYNECOLOGIC HISTORY:  No LMP recorded. Menarche: 68 years old Age at first live birth: 68 years old GX P 2 LMP age 69 Contraceptive: yes HRT never used  Hysterectomy? no BSO? 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 She lives at home with her son Ellen [place], age 45, and his son Ellen [place], who is 78 years old and works for [place]. 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 Son Ellen ([place]), age 76, works as a retail banker in [place]. Ellen [place] is Ellen Dav[place]port's only grandchild. Ellen Dav[place]port attends an [employer] [place] in [place].    ADVANCED DIRECTIVES: son Ellen is her HCPOA.  HEALTH MAINTENANCE: Social History   Tobacco Use   Smoking status: Never   Smokeless tobacco: Never  Substance Use Topics   Alcohol use: Yes    Alcohol/week: 1.0 standard drink of alcohol    Types: 1 Glasses of wine per week   Drug use: Never     Colonoscopy:  2022 at [place]  PAP: 2022  Bone density: date unknown   Not on File  Current Outpatient Medications  Medication Sig Dispense Refill   Ascorbic Acid (VITAMIN C) 500 MG CAPS Take 1 tablet by mouth daily.     azelastine (ASTELIN) 0.1 % nasal spray Place 1 spray into both nostrils daily as needed for allergies.     cholecalciferol (VITAMIN D3) 25 MCG (1000 UNIT) tablet Take 1,000 Units by mouth daily.     fluticasone (FLONASE) 50 MCG/ACT nasal spray Place 1 spray into both nostrils daily as needed.     Multiple Vitamin (MULTIVITAMIN) capsule Take 1 tablet by mouth daily.     olopatadine (PATANOL) 0.1 % ophthalmic solution Place 2 drops into both eyes daily as needed.     Omega 3-6-9 Fatty Acids (OMEGA 3-6-9 COMPLEX PO) Take 1 tablet by mouth daily.     Omega-3 Fatty Acids (FISH OIL) 1000 MG CAPS Take 1 tablet by mouth daily.     tamoxifen  (NOLVADEX ) 20 MG tablet Take 1 tablet (20 mg total) by mouth daily. 90 tablet 1   Turmeric 500 MG CAPS Take 1 tablet by mouth daily.     No current facility-administered medications for this visit.    OBJECTIVE: African-American woman who appears well  Vitals:   05/04/24 1008  BP: (!) 139/58  Pulse: 95  Resp: 17  Temp: 98.1 F (36.7 C)  SpO2: 97%     Body mass index is 33.72 kg/m.   Wt Readings from Last 3 Encounters:  05/04/24 221 lb 12.8 oz (100.6 kg)  10/28/23 227 lb 3.2 oz (103.1 kg)  04/29/23 220 lb 8 oz (100 kg)     ECOG FS:0 - Asymptomatic  Physical Exam Constitutional:      Appearance: Normal appearance.  Cardiovascular:     Rate and Rhythm: Normal rate and regular rhythm.  Chest:     Comments: Bilateral breasts inspected.  No palpable masses or regional adenopathy. Musculoskeletal:     Cervical back: Normal range of motion and neck supple. No rigidity.  Lymphadenopathy:     Cervical: No cervical adenopathy.  Neurological:     Mental Status: She is alert.      LAB RESULTS:  CMP     Component Value Date/Time   NA 140  10/14/2021 1119   K 3.8 10/14/2021 1119   CL 106 10/14/2021 1119   CO2 28 10/14/2021 1119   GLUCOSE 97 10/14/2021 1119   BUN 11 10/14/2021 1119   CREATININE 0.74 10/14/2021 1119   CALCIUM 8.9 10/14/2021 1119   PROT 7.6 10/14/2021 1119   ALBUMIN 4.0 10/14/2021 1119   AST 13 (L) 10/14/2021 1119   ALT 7 10/14/2021 1119   ALKPHOS 87 10/14/2021 1119   BILITOT 0.5 10/14/2021 1119   GFRNONAA >60 10/14/2021 1119    Lab Results  Component Value Date   WBC 5.1 05/04/2024   NEUTROABS 1.9 05/04/2024   HGB 12.5 05/04/2024   HCT 37.6 05/04/2024   MCV 83.2 05/04/2024   PLT 258 05/04/2024   04/26/2023 IMPRESSION: KNOWN-BIOPSY-PROVEN MALIGNANCY No mammograhic evidence of malignancy in the right breast. Stable appearance of the left breast  biopsy site of intermediate grade DCIS (2 years ago) without residual calcifications undergoing medical management per COMET trial. A follow-up mammogram in 6-12 months is recommended per COMET trial surveillance protocol.  This exam was interpreted at the [facility] location.  STUDIES: No results found.   ELIGIBLE FOR AVAILABLE RESEARCH PROTOCOL: COMET  ASSESSMENT: 68 y.o. [place] woman status post left breast biopsy 02/26/2021 showing ductal carcinoma in situ, grade 2, estrogen and progesterone receptor positive  (1) enrolled in COMET trial, observation arm  (2) anastrozole  started 05/13/2021  (A) bone density 05/10/2019 at [place] showed a T score of -0.5 Discontinued after 1 month   PLAN:  Low Grade Ductal Carcinoma In Situ (DCIS) Patient is part of COMET study evaluating the omission of surgery in low grade DCIS. Currently on Tamoxifen  with no reported side effects since May 2023. Assessment & Plan Ductal carcinoma in situ of the breast Three years post-diagnosis of low-grade DCIS, well-managed on tamoxifen  with no new adverse effects. Recent mammogram showed no changes. - Reviewed tamoxifen  adherence and confirmed no new side effects. -  No concerns on breast exam today - Encouraged regular physical activity, specifically walking. - Ordered follow-up mammogram for June. - Recommended follow-up visit in six months per protocol   Total encounter time 20 minutes.*   *Total Encounter Time as defined by the Centers for Medicare and Medicaid Services includes, in addition to the face-to-face time of a patient visit (documented in the note above) non-face-to-face time: obtaining and reviewing outside history, ordering and reviewing medications, tests or procedures, care coordination (communications with [place] health care professionals or caregivers) and documentation in the medical record.

## 2024-11-02 ENCOUNTER — Inpatient Hospital Stay: Admitting: Hematology and Oncology
